# Patient Record
Sex: Female | Born: 1945
Health system: Southern US, Community
[De-identification: ages and names within clinical notes are randomized; demographics above are authoritative.]

## PROBLEM LIST (undated history)

## (undated) DIAGNOSIS — I10 Essential (primary) hypertension: Secondary | ICD-10-CM

## (undated) DIAGNOSIS — Z923 Personal history of irradiation: Secondary | ICD-10-CM

## (undated) DIAGNOSIS — K5790 Diverticulosis of intestine, part unspecified, without perforation or abscess without bleeding: Secondary | ICD-10-CM

## (undated) DIAGNOSIS — C49A2 Gastrointestinal stromal tumor of stomach: Principal | ICD-10-CM

## (undated) DIAGNOSIS — C801 Malignant (primary) neoplasm, unspecified: Secondary | ICD-10-CM

## (undated) DIAGNOSIS — K219 Gastro-esophageal reflux disease without esophagitis: Secondary | ICD-10-CM

## (undated) DIAGNOSIS — K2270 Barrett's esophagus without dysplasia: Secondary | ICD-10-CM

## (undated) DIAGNOSIS — H409 Unspecified glaucoma: Secondary | ICD-10-CM

## (undated) DIAGNOSIS — K449 Diaphragmatic hernia without obstruction or gangrene: Secondary | ICD-10-CM

## (undated) DIAGNOSIS — C50919 Malignant neoplasm of unspecified site of unspecified female breast: Secondary | ICD-10-CM

## (undated) DIAGNOSIS — E7800 Pure hypercholesterolemia, unspecified: Secondary | ICD-10-CM

## (undated) HISTORY — DX: Unspecified glaucoma: H40.9

## (undated) HISTORY — PX: STOMACH SURGERY: SHX791

## (undated) HISTORY — DX: Gastro-esophageal reflux disease without esophagitis: K21.9

## (undated) HISTORY — DX: Diaphragmatic hernia without obstruction or gangrene: K44.9

## (undated) HISTORY — DX: Pure hypercholesterolemia, unspecified: E78.00

## (undated) HISTORY — PX: COLONOSCOPY: SHX174

## (undated) HISTORY — DX: Gastrointestinal stromal tumor of stomach: C49.A2

## (undated) HISTORY — PX: BREAST BIOPSY: SHX20

## (undated) HISTORY — DX: Personal history of irradiation: Z92.3

## (undated) HISTORY — PX: OTHER SURGICAL HISTORY: SHX169

## (undated) HISTORY — DX: Diverticulosis of intestine, part unspecified, without perforation or abscess without bleeding: K57.90

## (undated) HISTORY — DX: Essential (primary) hypertension: I10

## (undated) HISTORY — DX: Barrett's esophagus without dysplasia: K22.70

## (undated) HISTORY — DX: Malignant neoplasm of unspecified site of unspecified female breast: C50.919

## (undated) HISTORY — DX: Malignant (primary) neoplasm, unspecified: C80.1

---

## 1998-01-29 ENCOUNTER — Other Ambulatory Visit: Admission: RE | Admit: 1998-01-29 | Discharge: 1998-01-29 | Payer: Self-pay | Admitting: Obstetrics and Gynecology

## 1998-07-24 ENCOUNTER — Other Ambulatory Visit: Admission: RE | Admit: 1998-07-24 | Discharge: 1998-07-24 | Payer: Self-pay | Admitting: Radiology

## 1998-12-12 ENCOUNTER — Other Ambulatory Visit: Admission: RE | Admit: 1998-12-12 | Discharge: 1998-12-12 | Payer: Self-pay | Admitting: Internal Medicine

## 1998-12-12 ENCOUNTER — Encounter (INDEPENDENT_AMBULATORY_CARE_PROVIDER_SITE_OTHER): Payer: Self-pay | Admitting: Specialist

## 1999-01-20 ENCOUNTER — Ambulatory Visit (HOSPITAL_BASED_OUTPATIENT_CLINIC_OR_DEPARTMENT_OTHER): Admission: RE | Admit: 1999-01-20 | Discharge: 1999-01-20 | Payer: Self-pay | Admitting: Surgery

## 2001-11-08 ENCOUNTER — Other Ambulatory Visit: Admission: RE | Admit: 2001-11-08 | Discharge: 2001-11-08 | Payer: Self-pay | Admitting: Obstetrics and Gynecology

## 2002-12-25 ENCOUNTER — Other Ambulatory Visit: Admission: RE | Admit: 2002-12-25 | Discharge: 2002-12-25 | Payer: Self-pay | Admitting: Obstetrics and Gynecology

## 2003-09-19 ENCOUNTER — Ambulatory Visit (HOSPITAL_COMMUNITY): Admission: RE | Admit: 2003-09-19 | Discharge: 2003-09-19 | Payer: Self-pay | Admitting: Plastic Surgery

## 2003-09-19 ENCOUNTER — Encounter (INDEPENDENT_AMBULATORY_CARE_PROVIDER_SITE_OTHER): Payer: Self-pay | Admitting: Plastic Surgery

## 2003-09-19 ENCOUNTER — Ambulatory Visit (HOSPITAL_BASED_OUTPATIENT_CLINIC_OR_DEPARTMENT_OTHER): Admission: RE | Admit: 2003-09-19 | Discharge: 2003-09-19 | Payer: Self-pay | Admitting: Plastic Surgery

## 2004-03-30 DIAGNOSIS — C49A2 Gastrointestinal stromal tumor of stomach: Secondary | ICD-10-CM

## 2004-03-30 DIAGNOSIS — C801 Malignant (primary) neoplasm, unspecified: Secondary | ICD-10-CM

## 2004-03-30 HISTORY — DX: Gastrointestinal stromal tumor of stomach: C49.A2

## 2004-03-30 HISTORY — DX: Malignant (primary) neoplasm, unspecified: C80.1

## 2004-05-15 ENCOUNTER — Ambulatory Visit: Payer: Self-pay | Admitting: Gastroenterology

## 2004-05-21 ENCOUNTER — Ambulatory Visit: Payer: Self-pay | Admitting: Gastroenterology

## 2004-06-05 ENCOUNTER — Encounter: Admission: RE | Admit: 2004-06-05 | Discharge: 2004-06-05 | Payer: Self-pay | Admitting: General Surgery

## 2004-06-12 ENCOUNTER — Inpatient Hospital Stay (HOSPITAL_COMMUNITY): Admission: RE | Admit: 2004-06-12 | Discharge: 2004-06-16 | Payer: Self-pay | Admitting: General Surgery

## 2004-06-12 ENCOUNTER — Encounter (INDEPENDENT_AMBULATORY_CARE_PROVIDER_SITE_OTHER): Payer: Self-pay | Admitting: *Deleted

## 2004-06-12 HISTORY — PX: EXPLORATORY LAPAROTOMY: SUR591

## 2004-06-24 ENCOUNTER — Ambulatory Visit: Payer: Self-pay | Admitting: General Surgery

## 2004-07-03 ENCOUNTER — Ambulatory Visit: Payer: Self-pay | Admitting: Oncology

## 2004-07-17 ENCOUNTER — Encounter (INDEPENDENT_AMBULATORY_CARE_PROVIDER_SITE_OTHER): Payer: Self-pay | Admitting: *Deleted

## 2004-08-08 ENCOUNTER — Ambulatory Visit (HOSPITAL_COMMUNITY): Admission: RE | Admit: 2004-08-08 | Discharge: 2004-08-08 | Payer: Self-pay | Admitting: Oncology

## 2004-08-18 ENCOUNTER — Ambulatory Visit: Payer: Self-pay | Admitting: Oncology

## 2004-10-03 ENCOUNTER — Ambulatory Visit: Payer: Self-pay | Admitting: Oncology

## 2004-10-16 ENCOUNTER — Ambulatory Visit: Payer: Self-pay | Admitting: Gastroenterology

## 2004-11-26 ENCOUNTER — Ambulatory Visit (HOSPITAL_COMMUNITY): Admission: RE | Admit: 2004-11-26 | Discharge: 2004-11-26 | Payer: Self-pay | Admitting: Oncology

## 2004-11-27 ENCOUNTER — Ambulatory Visit: Payer: Self-pay | Admitting: Oncology

## 2004-12-12 ENCOUNTER — Ambulatory Visit: Payer: Self-pay | Admitting: Gastroenterology

## 2005-01-01 ENCOUNTER — Ambulatory Visit (HOSPITAL_COMMUNITY): Admission: RE | Admit: 2005-01-01 | Discharge: 2005-01-01 | Payer: Self-pay | Admitting: Gastroenterology

## 2005-01-01 ENCOUNTER — Encounter: Payer: Self-pay | Admitting: Gastroenterology

## 2005-01-23 ENCOUNTER — Ambulatory Visit: Payer: Self-pay | Admitting: Gastroenterology

## 2005-01-26 ENCOUNTER — Ambulatory Visit: Payer: Self-pay | Admitting: Oncology

## 2005-02-25 ENCOUNTER — Ambulatory Visit (HOSPITAL_COMMUNITY): Admission: RE | Admit: 2005-02-25 | Discharge: 2005-02-25 | Payer: Self-pay | Admitting: Oncology

## 2005-05-18 ENCOUNTER — Ambulatory Visit: Payer: Self-pay | Admitting: Oncology

## 2005-05-20 ENCOUNTER — Ambulatory Visit (HOSPITAL_COMMUNITY): Admission: RE | Admit: 2005-05-20 | Discharge: 2005-05-20 | Payer: Self-pay | Admitting: Oncology

## 2005-08-14 ENCOUNTER — Ambulatory Visit: Payer: Self-pay | Admitting: Oncology

## 2005-08-18 LAB — CBC WITH DIFFERENTIAL/PLATELET
Basophils Absolute: 0 10*3/uL (ref 0.0–0.1)
Eosinophils Absolute: 0.1 10*3/uL (ref 0.0–0.5)
HCT: 37.9 % (ref 34.8–46.6)
HGB: 13.1 g/dL (ref 11.6–15.9)
LYMPH%: 34.5 % (ref 14.0–48.0)
MCV: 104.6 fL — ABNORMAL HIGH (ref 81.0–101.0)
MONO#: 0.3 10*3/uL (ref 0.1–0.9)
MONO%: 7.9 % (ref 0.0–13.0)
NEUT#: 1.9 10*3/uL (ref 1.5–6.5)
Platelets: 249 10*3/uL (ref 145–400)
WBC: 3.4 10*3/uL — ABNORMAL LOW (ref 3.9–10.0)

## 2005-08-18 LAB — COMPREHENSIVE METABOLIC PANEL
Albumin: 4.1 g/dL (ref 3.5–5.2)
Alkaline Phosphatase: 48 U/L (ref 39–117)
BUN: 19 mg/dL (ref 6–23)
CO2: 27 mEq/L (ref 19–32)
Glucose, Bld: 92 mg/dL (ref 70–99)
Potassium: 3.8 mEq/L (ref 3.5–5.3)
Sodium: 138 mEq/L (ref 135–145)
Total Bilirubin: 0.9 mg/dL (ref 0.3–1.2)
Total Protein: 6.3 g/dL (ref 6.0–8.3)

## 2005-08-19 ENCOUNTER — Ambulatory Visit (HOSPITAL_COMMUNITY): Admission: RE | Admit: 2005-08-19 | Discharge: 2005-08-19 | Payer: Self-pay | Admitting: Oncology

## 2005-11-15 ENCOUNTER — Ambulatory Visit: Payer: Self-pay | Admitting: Oncology

## 2005-11-24 LAB — COMPREHENSIVE METABOLIC PANEL
Alkaline Phosphatase: 39 U/L (ref 39–117)
BUN: 17 mg/dL (ref 6–23)
CO2: 28 mEq/L (ref 19–32)
Creatinine, Ser: 0.75 mg/dL (ref 0.40–1.20)
Glucose, Bld: 117 mg/dL — ABNORMAL HIGH (ref 70–99)
Sodium: 140 mEq/L (ref 135–145)
Total Bilirubin: 1 mg/dL (ref 0.3–1.2)

## 2005-11-24 LAB — CBC WITH DIFFERENTIAL/PLATELET
Basophils Absolute: 0 10*3/uL (ref 0.0–0.1)
Eosinophils Absolute: 0.1 10*3/uL (ref 0.0–0.5)
HCT: 42.8 % (ref 34.8–46.6)
LYMPH%: 28.8 % (ref 14.0–48.0)
MCV: 99.4 fL (ref 81.0–101.0)
MONO%: 6.6 % (ref 0.0–13.0)
NEUT#: 4 10*3/uL (ref 1.5–6.5)
NEUT%: 63.3 % (ref 39.6–76.8)
Platelets: 238 10*3/uL (ref 145–400)
RBC: 4.31 10*6/uL (ref 3.70–5.32)

## 2005-11-27 ENCOUNTER — Ambulatory Visit (HOSPITAL_COMMUNITY): Admission: RE | Admit: 2005-11-27 | Discharge: 2005-11-27 | Payer: Self-pay | Admitting: Oncology

## 2005-12-03 ENCOUNTER — Ambulatory Visit: Payer: Self-pay | Admitting: Gastroenterology

## 2006-02-18 IMAGING — CT CT PELVIS W/ CM
2 of 5 series · 16 of 46 positions shown, 18 images · IV contrast (omnipaque)
Comparison: 11/26/04, 08/08/04, and 06/05/04.

CLINICAL DATA: History of gastrointestinal stromal tumor removed May 2004. 
 CHEST CT WITH CONTRAST:
TECHNIQUE: Multidetector CT imaging of the chest was performed following the standard protocol during bolus administration of intravenous contrast.
 Contrast:  100 cc Omnipaque 300
TECHNIQUE: Multidetector CT imaging of the abdomen was performed following the standard protocol during bolus administration of intravenous contrast.
TECHNIQUE: Multidetector CT imaging of the pelvis was performed following the standard protocol during bolus administration of intravenous contrast.

[Series 2: cap 5.0 b40f st · axial · 0.68mm/px · z∈[-605,-55]mm · 13 of 124 slices shown, 15 images]
[im 7/124  soft-tissue]
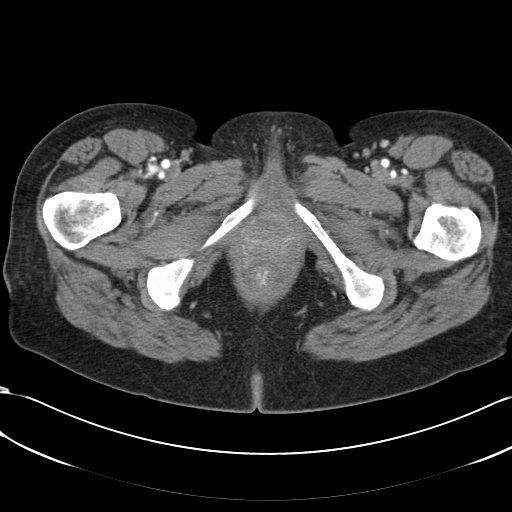
[im 7/124  bone]
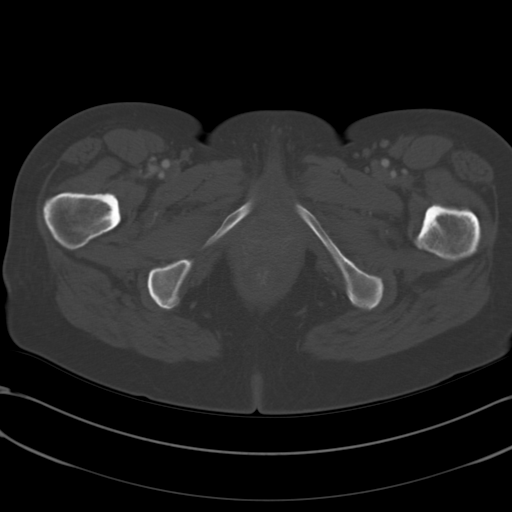
[im 14/124  soft-tissue]
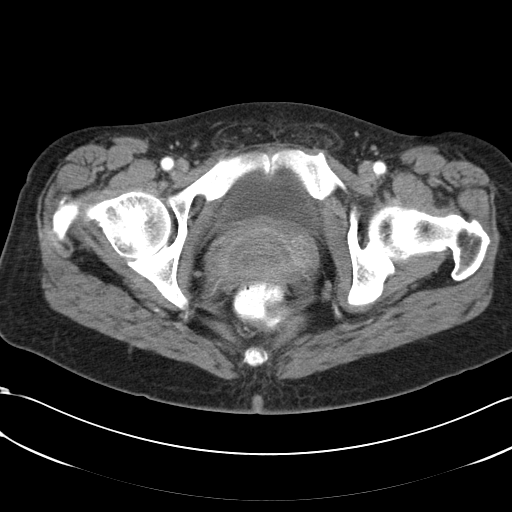
[im 28/124  soft-tissue]
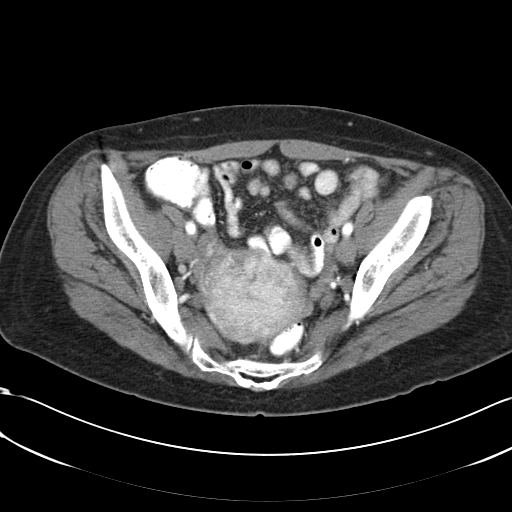
[im 35/124  soft-tissue]
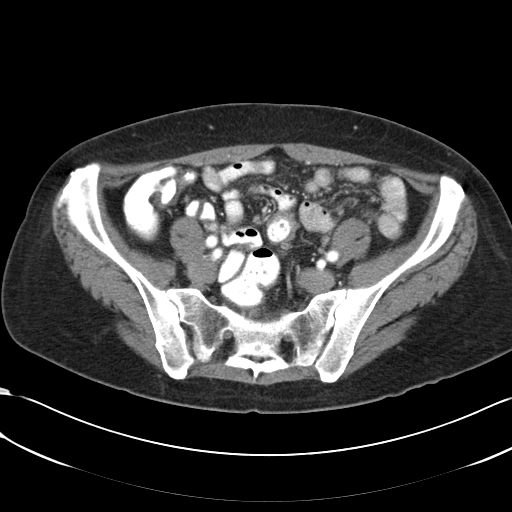
[im 42/124  soft-tissue]
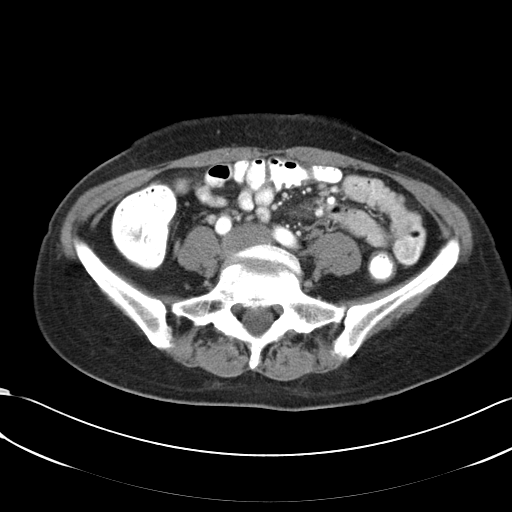
[im 55/124  soft-tissue]
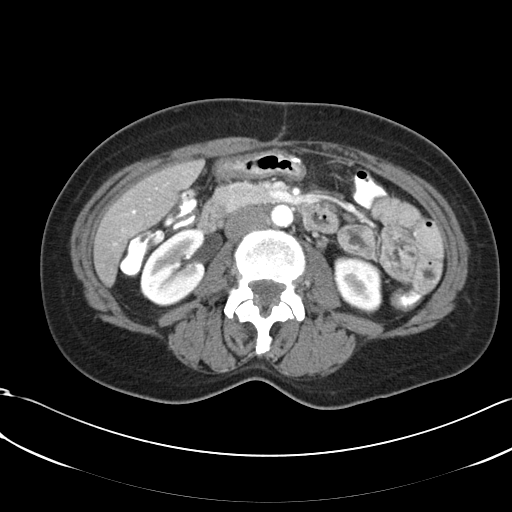
[im 62/124  soft-tissue]
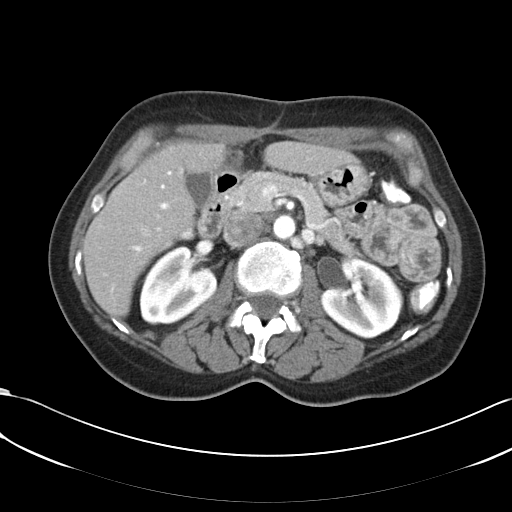
[im 69/124  soft-tissue]
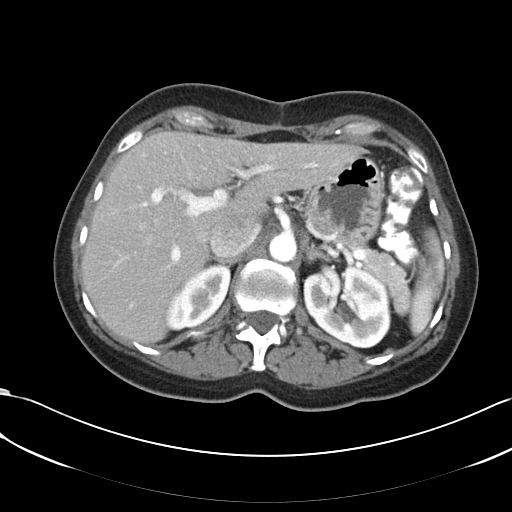
[im 83/124  soft-tissue]
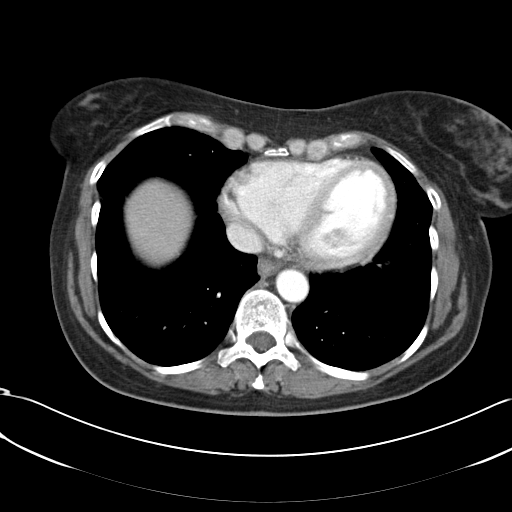
[im 83/124  bone]
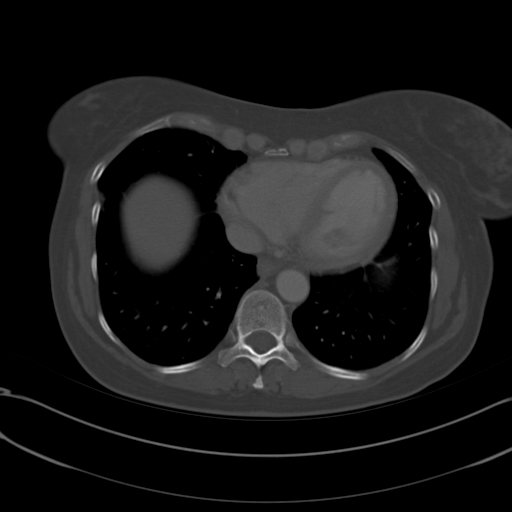
[im 89/124  soft-tissue]
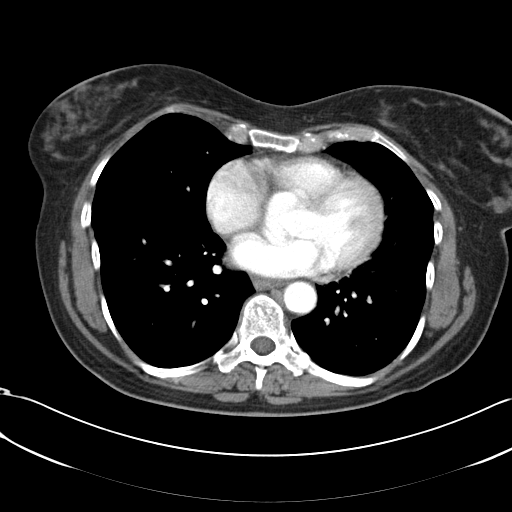
[im 96/124  soft-tissue]
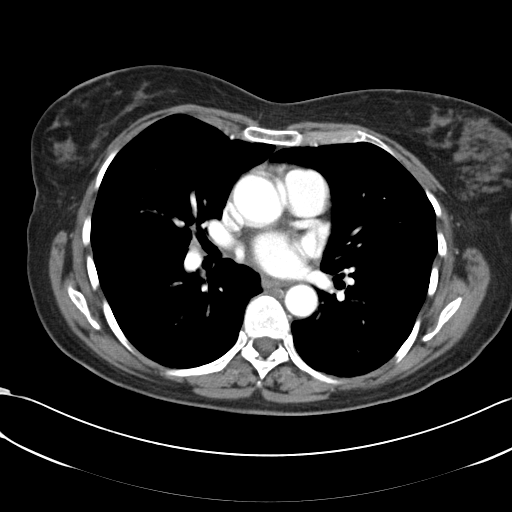
[im 110/124  soft-tissue]
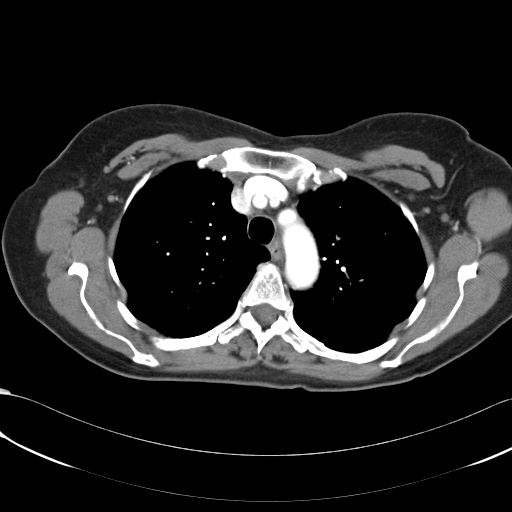
[im 117/124  soft-tissue]
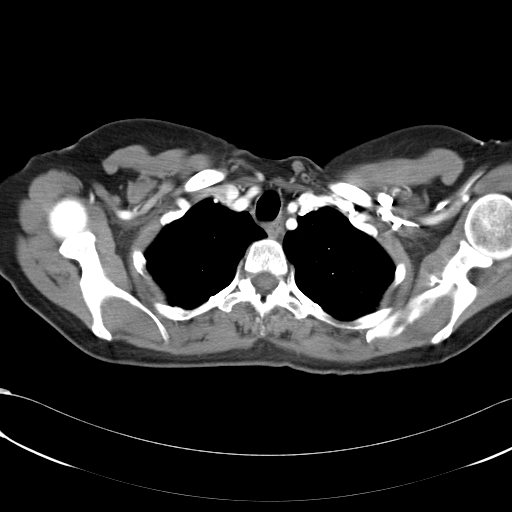

[Series 603: coronal · coronal · 1.24mm/px · 3 of 39 slices shown]
[im 13/39  soft-tissue]
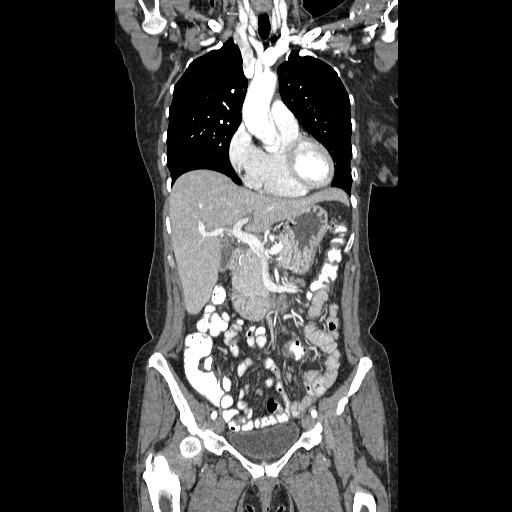
[im 17/39  soft-tissue]
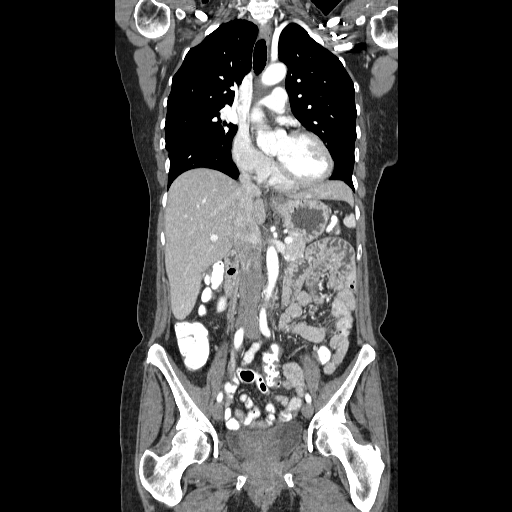
[im 22/39  soft-tissue]
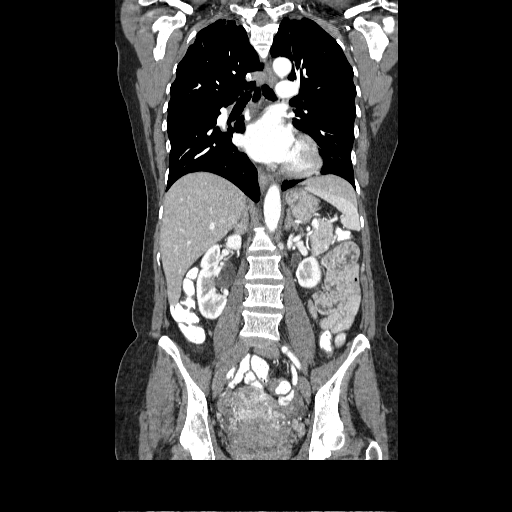

[16 of 46 positions shown; findings below may reference images not displayed]

FINDINGS: Chest wall, soft tissues, and bony structures are unremarkable.  No breast masses or axillary adenopathy.  There are a few small stable axillary lymph nodes bilaterally. 
 Heart size is normal.   Patient has a pectus deformity with some impression on the right ventricle.  No mediastinal or hilar adenopathy.  The esophagus is grossly normal.  The aorta is normal in caliber and no dissection is seen.  No worrisome lung masses or nodules.   No acute pulmonary findings.  There are a few patchy areas of subpleural atelectasis.  No effusions or pleural thickening.  No significant bony findings.
IMPRESSION: Unremarkable and stable CT appearance of the chest.  No evidence for metastatic disease. 
 ABDOMEN CT WITH CONTRAST:
FINDINGS: The liver, spleen, pancreas, adrenal glands, and kidneys are unremarkable and stable.  Surgical changes involving the stomach and some mild persistent antral wall thickening but no evidence for residual or recurrent tumor.  The duodenum, small bowel, and colon are unremarkable.  The aorta is normal in caliber.  No mesenteric or retroperitoneal masses or adenopathy.
IMPRESSION: 1.  Stable postsurgical changes involving the stomach with mild uniform antral wall thickening.  
 2.  No other significant findings in the abdomen. 
 PELVIS CT WITH CONTRAST:
FINDINGS: The appendix is visualized and is normal.  There are scattered areas of diverticulosis involving the sigmoid colon.  The uterus is slightly prominent but is stable.  Ovaries appear normal.  No pelvic masses, adenopathy, or free pelvic fluid collections.  No significant bony findings.  The bladder appears normal.
IMPRESSION: Unremarkable and stable CT appearance of the pelvis.  No evidence for metastatic disease.

## 2006-02-25 ENCOUNTER — Ambulatory Visit: Payer: Self-pay | Admitting: Oncology

## 2006-03-02 LAB — CBC WITH DIFFERENTIAL/PLATELET
BASO%: 0.4 % (ref 0.0–2.0)
Eosinophils Absolute: 0.1 10*3/uL (ref 0.0–0.5)
LYMPH%: 28.3 % (ref 14.0–48.0)
MCHC: 33.8 g/dL (ref 32.0–36.0)
MCV: 97.7 fL (ref 81.0–101.0)
MONO%: 6 % (ref 0.0–13.0)
Platelets: 275 10*3/uL (ref 145–400)
RBC: 4.47 10*6/uL (ref 3.70–5.32)

## 2006-03-02 LAB — COMPREHENSIVE METABOLIC PANEL
Alkaline Phosphatase: 45 U/L (ref 39–117)
Creatinine, Ser: 0.74 mg/dL (ref 0.40–1.20)
Glucose, Bld: 110 mg/dL — ABNORMAL HIGH (ref 70–99)
Sodium: 136 mEq/L (ref 135–145)
Total Bilirubin: 0.9 mg/dL (ref 0.3–1.2)
Total Protein: 7 g/dL (ref 6.0–8.3)

## 2006-03-03 ENCOUNTER — Encounter (INDEPENDENT_AMBULATORY_CARE_PROVIDER_SITE_OTHER): Payer: Self-pay | Admitting: *Deleted

## 2006-03-03 ENCOUNTER — Ambulatory Visit (HOSPITAL_COMMUNITY): Admission: RE | Admit: 2006-03-03 | Discharge: 2006-03-03 | Payer: Self-pay | Admitting: Oncology

## 2006-03-31 ENCOUNTER — Ambulatory Visit: Payer: Self-pay | Admitting: Internal Medicine

## 2006-06-03 ENCOUNTER — Encounter (INDEPENDENT_AMBULATORY_CARE_PROVIDER_SITE_OTHER): Payer: Self-pay | Admitting: Specialist

## 2006-06-03 ENCOUNTER — Ambulatory Visit: Payer: Self-pay | Admitting: Internal Medicine

## 2006-06-07 ENCOUNTER — Ambulatory Visit: Payer: Self-pay | Admitting: Oncology

## 2006-07-06 LAB — CBC WITH DIFFERENTIAL/PLATELET
Basophils Absolute: 0 10*3/uL (ref 0.0–0.1)
Eosinophils Absolute: 0.2 10*3/uL (ref 0.0–0.5)
HGB: 15.4 g/dL (ref 11.6–15.9)
MCV: 96.4 fL (ref 81.0–101.0)
MONO#: 0.3 10*3/uL (ref 0.1–0.9)
MONO%: 5.4 % (ref 0.0–13.0)
NEUT#: 3.6 10*3/uL (ref 1.5–6.5)
RBC: 4.55 10*6/uL (ref 3.70–5.32)
RDW: 13.1 % (ref 11.3–14.5)
WBC: 5.8 10*3/uL (ref 3.9–10.0)

## 2006-07-06 LAB — COMPREHENSIVE METABOLIC PANEL
Albumin: 4.3 g/dL (ref 3.5–5.2)
BUN: 20 mg/dL (ref 6–23)
CO2: 28 mEq/L (ref 19–32)
Calcium: 9.6 mg/dL (ref 8.4–10.5)
Chloride: 103 mEq/L (ref 96–112)
Glucose, Bld: 129 mg/dL — ABNORMAL HIGH (ref 70–99)
Potassium: 4 mEq/L (ref 3.5–5.3)
Sodium: 141 mEq/L (ref 135–145)
Total Protein: 6.9 g/dL (ref 6.0–8.3)

## 2006-09-30 ENCOUNTER — Ambulatory Visit: Payer: Self-pay | Admitting: Oncology

## 2006-10-05 LAB — CBC WITH DIFFERENTIAL/PLATELET
Basophils Absolute: 0 10*3/uL (ref 0.0–0.1)
Eosinophils Absolute: 0.2 10*3/uL (ref 0.0–0.5)
HGB: 14.7 g/dL (ref 11.6–15.9)
MCV: 96 fL (ref 81.0–101.0)
MONO#: 0.4 10*3/uL (ref 0.1–0.9)
MONO%: 7.7 % (ref 0.0–13.0)
NEUT#: 3 10*3/uL (ref 1.5–6.5)
RBC: 4.46 10*6/uL (ref 3.70–5.32)
RDW: 13 % (ref 11.3–14.5)
WBC: 5.2 10*3/uL (ref 3.9–10.0)
lymph#: 1.6 10*3/uL (ref 0.9–3.3)

## 2006-10-05 LAB — COMPREHENSIVE METABOLIC PANEL
AST: 11 U/L (ref 0–37)
Albumin: 4.2 g/dL (ref 3.5–5.2)
Alkaline Phosphatase: 49 U/L (ref 39–117)
Calcium: 9.2 mg/dL (ref 8.4–10.5)
Chloride: 99 mEq/L (ref 96–112)
Glucose, Bld: 103 mg/dL — ABNORMAL HIGH (ref 70–99)
Potassium: 3.6 mEq/L (ref 3.5–5.3)
Sodium: 137 mEq/L (ref 135–145)
Total Protein: 6.8 g/dL (ref 6.0–8.3)

## 2007-01-31 ENCOUNTER — Ambulatory Visit: Payer: Self-pay | Admitting: Internal Medicine

## 2007-02-15 ENCOUNTER — Encounter (INDEPENDENT_AMBULATORY_CARE_PROVIDER_SITE_OTHER): Payer: Self-pay | Admitting: *Deleted

## 2007-02-15 ENCOUNTER — Ambulatory Visit: Payer: Self-pay | Admitting: Internal Medicine

## 2007-03-03 ENCOUNTER — Ambulatory Visit: Payer: Self-pay | Admitting: Oncology

## 2007-03-08 ENCOUNTER — Ambulatory Visit (HOSPITAL_COMMUNITY): Admission: RE | Admit: 2007-03-08 | Discharge: 2007-03-08 | Payer: Self-pay | Admitting: Oncology

## 2007-03-08 ENCOUNTER — Encounter (INDEPENDENT_AMBULATORY_CARE_PROVIDER_SITE_OTHER): Payer: Self-pay | Admitting: *Deleted

## 2007-03-08 LAB — COMPREHENSIVE METABOLIC PANEL
ALT: 13 U/L (ref 0–35)
AST: 11 U/L (ref 0–37)
Albumin: 3.6 g/dL (ref 3.5–5.2)
Alkaline Phosphatase: 70 U/L (ref 39–117)
Potassium: 3.5 mEq/L (ref 3.5–5.3)
Sodium: 141 mEq/L (ref 135–145)
Total Protein: 6.7 g/dL (ref 6.0–8.3)

## 2007-03-08 LAB — CBC WITH DIFFERENTIAL/PLATELET
EOS%: 2 % (ref 0.0–7.0)
MCH: 33 pg (ref 26.0–34.0)
MCV: 94.6 fL (ref 81.0–101.0)
MONO%: 3.3 % (ref 0.0–13.0)
NEUT#: 6 10*3/uL (ref 1.5–6.5)
RBC: 4.26 10*6/uL (ref 3.70–5.32)
RDW: 12.5 % (ref 11.3–14.5)
lymph#: 1.8 10*3/uL (ref 0.9–3.3)

## 2007-09-07 ENCOUNTER — Ambulatory Visit: Payer: Self-pay | Admitting: Oncology

## 2007-09-12 ENCOUNTER — Encounter: Payer: Self-pay | Admitting: Internal Medicine

## 2008-03-09 ENCOUNTER — Ambulatory Visit: Payer: Self-pay | Admitting: Oncology

## 2008-03-13 ENCOUNTER — Encounter: Payer: Self-pay | Admitting: Internal Medicine

## 2008-07-13 DIAGNOSIS — Z8719 Personal history of other diseases of the digestive system: Secondary | ICD-10-CM | POA: Insufficient documentation

## 2008-07-13 DIAGNOSIS — K5730 Diverticulosis of large intestine without perforation or abscess without bleeding: Secondary | ICD-10-CM | POA: Insufficient documentation

## 2008-07-13 DIAGNOSIS — D214 Benign neoplasm of connective and other soft tissue of abdomen: Secondary | ICD-10-CM | POA: Insufficient documentation

## 2008-07-13 DIAGNOSIS — K2270 Barrett's esophagus without dysplasia: Secondary | ICD-10-CM | POA: Insufficient documentation

## 2008-07-13 DIAGNOSIS — K449 Diaphragmatic hernia without obstruction or gangrene: Secondary | ICD-10-CM | POA: Insufficient documentation

## 2008-07-13 DIAGNOSIS — I10 Essential (primary) hypertension: Secondary | ICD-10-CM | POA: Insufficient documentation

## 2008-07-18 ENCOUNTER — Ambulatory Visit: Payer: Self-pay | Admitting: Internal Medicine

## 2008-07-19 LAB — CONVERTED CEMR LAB
Basophils Absolute: 0.1 10*3/uL (ref 0.0–0.1)
Basophils Relative: 1.7 % (ref 0.0–3.0)
Eosinophils Absolute: 0.2 10*3/uL (ref 0.0–0.7)
Eosinophils Relative: 3.3 % (ref 0.0–5.0)
HCT: 43.9 % (ref 36.0–46.0)
Hemoglobin: 15.5 g/dL — ABNORMAL HIGH (ref 12.0–15.0)
Iron: 148 ug/dL — ABNORMAL HIGH (ref 42–145)
Lymphocytes Relative: 32.1 % (ref 12.0–46.0)
Lymphs Abs: 2.1 10*3/uL (ref 0.7–4.0)
MCHC: 35.2 g/dL (ref 30.0–36.0)
MCV: 97.4 fL (ref 78.0–100.0)
Monocytes Absolute: 0.4 10*3/uL (ref 0.1–1.0)
Monocytes Relative: 5.8 % (ref 3.0–12.0)
Neutro Abs: 3.7 10*3/uL (ref 1.4–7.7)
Neutrophils Relative %: 57.1 % (ref 43.0–77.0)
Platelets: 231 10*3/uL (ref 150.0–400.0)
RBC: 4.51 M/uL (ref 3.87–5.11)
RDW: 11.9 % (ref 11.5–14.6)
Saturation Ratios: 43.9 % (ref 20.0–50.0)
Transferrin: 240.6 mg/dL (ref 212.0–360.0)
Vitamin B-12: 395 pg/mL (ref 211–911)
WBC: 6.5 10*3/uL (ref 4.5–10.5)

## 2008-09-07 ENCOUNTER — Ambulatory Visit: Payer: Self-pay | Admitting: Oncology

## 2008-09-11 ENCOUNTER — Encounter: Payer: Self-pay | Admitting: Internal Medicine

## 2008-10-10 ENCOUNTER — Ambulatory Visit (HOSPITAL_COMMUNITY): Admission: RE | Admit: 2008-10-10 | Discharge: 2008-10-10 | Payer: Self-pay | Admitting: Obstetrics and Gynecology

## 2008-10-10 ENCOUNTER — Encounter (INDEPENDENT_AMBULATORY_CARE_PROVIDER_SITE_OTHER): Payer: Self-pay | Admitting: Obstetrics and Gynecology

## 2008-10-23 ENCOUNTER — Ambulatory Visit: Payer: Self-pay | Admitting: Internal Medicine

## 2008-10-23 ENCOUNTER — Encounter: Payer: Self-pay | Admitting: Internal Medicine

## 2008-10-28 ENCOUNTER — Encounter: Payer: Self-pay | Admitting: Internal Medicine

## 2009-04-10 ENCOUNTER — Ambulatory Visit: Payer: Self-pay | Admitting: Oncology

## 2009-04-12 ENCOUNTER — Encounter: Payer: Self-pay | Admitting: Internal Medicine

## 2009-05-02 ENCOUNTER — Telehealth (INDEPENDENT_AMBULATORY_CARE_PROVIDER_SITE_OTHER): Payer: Self-pay | Admitting: *Deleted

## 2009-10-08 ENCOUNTER — Ambulatory Visit: Payer: Self-pay | Admitting: Oncology

## 2009-10-10 ENCOUNTER — Encounter: Payer: Self-pay | Admitting: Internal Medicine

## 2010-03-07 ENCOUNTER — Telehealth: Payer: Self-pay | Admitting: Internal Medicine

## 2010-04-29 NOTE — Progress Notes (Signed)
Summary: Records request from [facility]  Request for records received from [facility]. Request forwarded to [employer]. Dena Chavis  May 02, 2009 12:21 PM

## 2010-04-29 NOTE — Letter (Signed)
[facility]   Imported By: Sherian Rein 11/07/2009 09:58:27  _____________________________________________________________________  External Attachment:    Type:   Image     Comment:   External Document

## 2010-04-29 NOTE — Progress Notes (Signed)
Summary: Refill  Medications Added PANTOPRAZOLE SODIUM 40 MG  TBEC (PANTOPRAZOLE SODIUM) Take 1 tablet by mouth once a day. MUST KEEP OFFICE VISIT FOR FURTHER REFILLS!       Phone Note Call from Patient Call back at Home Phone [phone]   Caller: Patient Call For: Dr. Juanda Chance Reason for Call: Refill Medication Details for Reason: Refill Summary of Call: Patient needs refill of pantoprazole sodium as she is completely out.  She was also informed that she needed an OV with Dr. Juanda Chance.  An OV has been scheduled for 04/17/10 @ 8:30 a.m. Initial call taken by: Schuyler Amor,  March 07, 2010 9:27 AM  Follow-up for Phone Call        prescription sent to patient's pharmacy until her upcoming appointment in January. Follow-up by: Lamona Curl CMA Duncan Dull),  March 07, 2010 9:28 AM    New/Updated Medications: PANTOPRAZOLE SODIUM 40 MG  TBEC (PANTOPRAZOLE SODIUM) Take 1 tablet by mouth once a day. MUST KEEP OFFICE VISIT FOR FURTHER REFILLS! Prescriptions: PANTOPRAZOLE SODIUM 40 MG  TBEC (PANTOPRAZOLE SODIUM) Take 1 tablet by mouth once a day. MUST KEEP OFFICE VISIT FOR FURTHER REFILLS!  #30 x 1   Entered by:   Lamona Curl CMA (AAMA)   Authorized by:   Hart Carwin MD   Signed by:   Lamona Curl CMA (AAMA) on 03/07/2010   Method used:   Electronically to        [facility] (retail)       [address]       [place], [place]  [id number]       Ph: [phone] or [phone]       Fax: [phone]   RxID:   [id number]

## 2010-04-29 NOTE — Letter (Signed)
[facility]   Imported By: Sherian Rein 05/13/2009 08:26:20  _____________________________________________________________________  External Attachment:    Type:   Image     Comment:   External Document

## 2010-05-12 ENCOUNTER — Ambulatory Visit (INDEPENDENT_AMBULATORY_CARE_PROVIDER_SITE_OTHER): Payer: BC Managed Care – PPO | Admitting: Internal Medicine

## 2010-05-12 ENCOUNTER — Encounter (INDEPENDENT_AMBULATORY_CARE_PROVIDER_SITE_OTHER): Payer: Self-pay | Admitting: *Deleted

## 2010-05-12 ENCOUNTER — Encounter: Payer: Self-pay | Admitting: Internal Medicine

## 2010-05-12 ENCOUNTER — Other Ambulatory Visit: Payer: BC Managed Care – PPO

## 2010-05-12 ENCOUNTER — Other Ambulatory Visit: Payer: Self-pay | Admitting: Internal Medicine

## 2010-05-12 DIAGNOSIS — K2270 Barrett's esophagus without dysplasia: Secondary | ICD-10-CM

## 2010-05-12 DIAGNOSIS — Z8719 Personal history of other diseases of the digestive system: Secondary | ICD-10-CM

## 2010-05-12 LAB — CBC WITH DIFFERENTIAL/PLATELET
Basophils Absolute: 0 10*3/uL (ref 0.0–0.1)
Eosinophils Absolute: 0.2 10*3/uL (ref 0.0–0.7)
HCT: 44.9 % (ref 36.0–46.0)
Lymphs Abs: 1.9 10*3/uL (ref 0.7–4.0)
MCHC: 34.7 g/dL (ref 30.0–36.0)
MCV: 99.2 fl (ref 78.0–100.0)
Monocytes Absolute: 0.4 10*3/uL (ref 0.1–1.0)
Neutrophils Relative %: 61.6 % (ref 43.0–77.0)
Platelets: 194 10*3/uL (ref 150.0–400.0)
RDW: 12.8 % (ref 11.5–14.6)
WBC: 6.4 10*3/uL (ref 4.5–10.5)

## 2010-05-12 LAB — IBC PANEL
Iron: 83 ug/dL (ref 42–145)
Transferrin: 246.9 mg/dL (ref 212.0–360.0)

## 2010-05-21 NOTE — Assessment & Plan Note (Signed)
Summary: Medication Refill     History of Present Illness Visit Type: Follow-up Visit Primary GI MD: Lina Sar MD Primary Provider: Joselyn Arrow, MD Requesting Provider: na Chief Complaint: F/u for barrett's esophagus and patient needs refill on Pantoprazole. 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 Followed by Dr. Truett Perna,. Diagnosed with Barrett's esophagus on prior upper endoscopy but not confirmed  onlast endoscopy in July 2010 which showed no evidence of Goblet cells or metaplasia. She has no specific GI complaints ,pantoprazole 40 mg a day but would like to reduce it to minimal dose. Last colonoscopy in 2000 and a recall colonoscopy plan followup in 2018.   GI Review of Systems      Denies abdominal pain, acid reflux, belching, bloating, chest pain, dysphagia with liquids, dysphagia with solids, heartburn, loss of appetite, nausea, vomiting, vomiting blood, weight loss, and  weight gain.        Denies anal fissure, black tarry stools, change in bowel habit, constipation, diarrhea, diverticulosis, fecal incontinence, heme positive stool, hemorrhoids, irritable bowel syndrome, jaundice, light color stool, liver problems, rectal bleeding, and  rectal pain.    Current Medications (verified): 1)  Pantoprazole Sodium 40 Mg  Tbec (Pantoprazole Sodium) .... Take 1 Tablet By Mouth Once A Day. Must Keep Office Visit For Further Refills! 2)  Estradiol 0.06 Mg (Estradiol) .... One Tablet By Mouth Once Daily 3)  Prometrium 100 Mg Caps (Progesterone Micronized) .Marland Kitchen.. 1 Cap By Mouth Once Daily 4)  Potassium Chloride Cr 10 Meq Cr-Caps (Potassium Chloride) .Marland Kitchen.. 1 Capsule By Mouth Once Daily 5)  Hydrochlorothiazide 25 Mg Tabs (Hydrochlorothiazide) .Marland Kitchen.. 1 Tablet By Mouth Once Daily 6)  Pravastatin Sodium 40 Mg Tabs (Pravastatin  Sodium) .Marland Kitchen.. 1 Tablet By Mouth Once Daily 7)  Atenolol 25 Mg Tabs (Atenolol) .Marland Kitchen.. 1 Tablet By Mouth Once Daily 8)  Aspirin 81 Mg  Tabs (Aspirin) .... One Tablet By Mouth Once Daily  Allergies (verified): No Known Drug Allergies  Past History:  Past Medical History: HIATAL HERNIA (ICD-553.3) GASTRITIS, HX OF (ICD-V12.79) DIVERTICULOSIS, COLON (ICD-562.10) Hx of UNSPECIFIED ESSENTIAL HYPERTENSION (ICD-401.9) OTH BEN NEOPLASM CONNECTIVE&OTH SOFT TISSUE ABD (ICD-215.5) BARRETTS ESOPHAGUS (ICD-530.85)  Past Surgical History: Reviewed history from 07/13/2008 and no changes required. Breast Lumpectomy Resection of gastrointestinal stromal tumor  Family History: Reviewed history from 07/18/2008 and no changes required. No FH of Colon Cancer:  Social History: Reviewed history from 07/18/2008 and no changes required. Alcohol Use - yes Illicit Drug Use - no Occupation: retired Associate Professor Patient has never smoked.  Daily Caffeine Use 2 cups per day  Review of Systems  The patient denies allergy/sinus, anemia, anxiety-new, arthritis/joint pain, back pain, blood in urine, breast changes/lumps, change in vision, confusion, cough, coughing up blood, depression-new, fainting, fatigue, fever, headaches-new, hearing problems, heart murmur, heart rhythm changes, itching, menstrual pain, muscle pains/cramps, night sweats, nosebleeds, pregnancy symptoms, shortness of breath, skin rash, sleeping problems, sore throat, swelling of feet/legs, swollen lymph glands, thirst - excessive , urination - excessive , urination changes/pain, urine leakage, vision changes, and voice change.    Vital Signs:  Patient profile:   65 year old female Height:      69 inches Weight:      149 pounds BMI:     22.08 BSA:     1.82 Pulse rate:   64 /  minute Pulse rhythm:   regular BP sitting:   142 / 82  (left arm) Cuff size:   regular  Vitals Entered By: Ok Anis CMA (May 12, 2010 11:33 AM)  Physical  Exam  General:  Well developed, well nourished, no acute distress. Eyes:  PERRLA, no icterus. Mouth:  No deformity or lesions, dentition normal. Neck:  Supple; no masses or thyromegaly. Lungs:  Clear throughout to auscultation. Heart:  Regular rate and rhythm; no murmurs, rubs,  or bruits. Abdomen:  well-healed surgical scar. Normoactive bowel sounds. Rectal:  small amount of Hemoccult negative stool Extremities:  No clubbing, cyanosis, edema or deformities noted. Skin:  Intact without significant lesions or rashes. Psych:  Alert and cooperative. Normal mood and affect.   Impression & Recommendations:  Problem # 1:  BARRETTS ESOPHAGUS (ICD-530.85) not confirmed on last endoscopy in July 2010. Next endoscopy July 2015 Orders: TLB-IBC Pnl (Iron/FE;Transferrin) (83550-IBC)  Problem # 2:  GASTRITIS, HX OF (ICD-V12.79) Will switch from pantoprazole to Pepcid 40 mg daily also check her B12 level today as well as iron stores. She had a complete physical exam by Dr.Tisovec in January 2012. Orders: TLB-IBC Pnl (Iron/FE;Transferrin) (83550-IBC)  Other Orders: TLB-CBC Platelet - w/Differential (85025-CBCD)  Patient Instructions: 1)  Please pick up your prescriptions at the pharmacy. Electronic prescription(s) has already been sent for Pepcid 40 mg, take 1 tablet once daily in place of Pantoprazole. 2)  recall upper endoscopy 09/2013 3)  Recall colonoscopy November 2018 4)  Your physician requests that you go to the basement floor of our office to have the following labwork completed before leaving today: CBC, IBC B12 panel 5)  The medication list was reviewed and reconciled.  All changed / newly prescribed medications were explained.  A complete medication list was provided to the patient / caregiver.  Appended Document: Orders Update    Clinical Lists Changes  Medications: Changed medication from PANTOPRAZOLE SODIUM 40 MG  TBEC (PANTOPRAZOLE SODIUM) Take 1 tablet by mouth once a day.  MUST KEEP OFFICE VISIT FOR FURTHER REFILLS! to PEPCID 40 MG TABS (FAMOTIDINE) Take 1 tablet by mouth once a day (pharmacy-please d/c any prescription for pantoprazole) - Signed Rx of PEPCID 40 MG TABS (FAMOTIDINE) Take 1 tablet by mouth once a day (pharmacy-please d/c any prescription for pantoprazole);  #90 x 3;  Signed;  Entered by: Lamona Curl CMA (AAMA);  Authorized by: Hart Carwin MD;  Method used: Electronically to [facility] [address], [place], [place]  [id number], Ph: [phone] or [phone], Fax: [phone]    Prescriptions: PEPCID 40 MG TABS (FAMOTIDINE) Take 1 tablet by mouth once a day (pharmacy-please d/c any prescription for pantoprazole)  #90 x 3   Entered by:   Lamona Curl CMA (AAMA)   Authorized by:   Hart Carwin MD   Signed by:   Lamona Curl CMA (AAMA) on 05/12/2010   Method used:   Electronically to        [facility] (retail)       [address]       [place], [place]  [id number]       Ph: [phone] or [phone]       Fax: [phone]   RxID:   [id number]

## 2010-07-06 LAB — CBC
MCHC: 34.9 g/dL (ref 30.0–36.0)
MCV: 98.9 fL (ref 78.0–100.0)
Platelets: 232 10*3/uL (ref 150–400)
WBC: 4.9 10*3/uL (ref 4.0–10.5)

## 2010-07-06 LAB — DIFFERENTIAL
Basophils Relative: 1 % (ref 0–1)
Eosinophils Absolute: 0.1 10*3/uL (ref 0.0–0.7)
Neutrophils Relative %: 53 % (ref 43–77)

## 2010-07-06 LAB — ELECTROLYTE PANEL
Potassium: 3.5 mEq/L (ref 3.5–5.1)
Sodium: 138 mEq/L (ref 135–145)

## 2010-08-12 NOTE — Op Note (Signed)
Hailey Daniels, IGLEHART             ACCOUNT NO.:  [id number]   MEDICAL RECORD NO.:  [id number]          PATIENT TYPE:  AMB   LOCATION:  SDC                           FACILITY:  [facility]   PHYSICIAN:  Sherry A. Dickstein, M.D.DATE OF BIRTH:  [birth year]   DATE OF PROCEDURE:  10/10/2008  DATE OF DISCHARGE:  10/10/2008                               OPERATIVE REPORT   PREOPERATIVE DIAGNOSES:  1. Postmenopausal bleeding.  2. Endometrial polyps.   POSTOPERATIVE DIAGNOSES:  1. Postmenopausal bleeding.  2. Endometrial polyps.   PROCEDURE:  D and C hysteroscopy with resectoscope.   SURGEON:  Sherry A. Rosalio Macadamia, MD   ANESTHESIA:  MAC.   INDICATIONS:  This is a 65 year old, G3, P3-0-0-3, woman who has had  about 3 episodes of vaginal bleeding over the past year.  She has been  on hormone therapy and was evaluated first with an ultrasound followed  by sonohysterogram.  Sonohysterogram showed several endometrial polyps  present, and therefore the patient is brought to the operating room for  a D and C hysteroscopy with resectoscope.   FINDINGS:  Normal-sized retroverted uterus with no adnexal mass and  endometrial polyps present.   PROCEDURE:  The patient was brought into the operating room and given  adequate IV sedation.  She was placed in a dorsal lithotomy position.  Her perineum was washed with Betadine.  The patient was draped in  sterile fashion.  Speculum was placed within the vagina.  Vagina was  washed with Betadine.  Paracervical block was administered with 1%  Nesacaine.  Anterior lip of the cervix was grasped with a single-toothed  tenaculum.  Cervix was sounded.  Cervix was dilated with Pratt dilators  to a #31.  Hysteroscope was introduced in the endometrial cavity, and  pictures were obtained.  Using a single loop right angle resector, the  polyps were resected and then superficial sheets of endometrial tissue  were resected.  Small bleeders were cauterized.  Pictures were  obtained.  All instruments were removed from the vagina.  The patient was taken out  of the dorsal lithotomy position.  She was awakened.  She was moved from  the operating table to a stretcher in stable condition.   COMPLICATIONS:  None.   ESTIMATED BLOOD LOSS:  Less then 5 mL.   GLYCINE DIFFERENTIAL:  Minus 30 mL.      Sherry A. Rosalio Macadamia, M.D.  Electronically Signed     SAD/MEDQ  D:  10/10/2008  T:  10/11/2008  Job:  [id number]

## 2010-08-15 NOTE — Discharge Summary (Signed)
NAMEJAZMYN, OFFNER             ACCOUNT NO.:  [id number]   MEDICAL RECORD NO.:  [id number]          PATIENT TYPE:  INP   LOCATION:  0468                         FACILITY:  [facility]   PHYSICIAN:  Angelia Mould. Derrell Lolling, M.D.DATE OF BIRTH:  26-May-1945   DATE OF ADMISSION:  06/12/2004  DATE OF DISCHARGE:  06/16/2004                                 DISCHARGE SUMMARY   FINAL DIAGNOSES:  1.  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  2.  Hypertension.  3.  Hyperlipidemia.  4.  Multiparity.   OPERATION PERFORMED:  Exploratory laparotomy, excisional biopsy of  hepatoduodenal ligament lymph node, wedge resection of gastrointestinal  stromal tumor of the gastric antrum. Date of surgery was June 12, 2004.   HISTORY:  This is a 65 year old white female in good health. She presented  with painless melena 1 month prior. She was noted to have guaiac positive  stool but her hemoglobin was 14.7. She was placed on Nexium. An upper  endoscopy was performed on May 21, 2004 which showed a small hiatal  hernia and Barrett's esophagus. She had what appeared to be a submucosal  tumor in the antrum of the stomach with central ulceration thought to be a  leiomyoma and the source of bleeding. A biopsy of this showed benign gastric  mucosa, no evidence of malignancy. I was asked to see her as an outpatient.  Evaluation was otherwise unremarkable. We obtained a CT scan of the chest,  abdomen, and pelvis on June 05, 2004 and this shows a 3.1 cm well-  circumscribed mass in the prepyloric region of the stomach. The possibility  of a stalk was mentioned. There was no metastatic disease or adenopathy and  no free fluid. She was advised to undergo elective resection of this and was  admitted to the hospital electively.   PHYSICAL EXAMINATION:  GENERAL:  A thin, pleasant, middle-aged woman in no  distress.  VITAL SIGNS:  Weight 152, blood pressure 148/89, pulse 59.  NECK:  No adenopathy.  LUNGS:   Clear to auscultation.  HEART:  Regular rate and rhythm, no murmur.  ABDOMEN:  Soft, nontender. Liver and spleen not enlarged. No palpable mass,  no hernia.  LYMPHATICS:  No enlarged lymph nodes in the neck or groin.   HOSPITAL COURSE:  On the date of admission, the patient was taken to the  operating room. I found some soft lymph nodes in the hepatoduodenal ligament  and excised one of these. I could palpate what appeared to be a firm, smooth  tumor in the gastric antrum. This appeared to be pedunculated and attached  to a very focal area of the gastric wall. I was able to simply resect this  by wedge resection using the stapling devices without compromising the lumen  of the stomach. We examined this in the operating room and had our  pathologist look at it. It appeared that we had resected it quite nicely and  it appeared to be a benign leiomyoma. We chose to do no further resection at  this time.   Postoperatively, the patient did very well. Nasogastric tube was  removed in  24-48 hours and she assumed a diet and had no problem with that.   Final pathology report showed that the lymph node from the hepatoduodenal  ligament was just benign reactive tissue. The tumor in the stomach was a  gastrointestinal stromal tumor of low grade. Margins were negative. No  atypia was noted. Mytotic activity was very low and this suggested a very  low risk for aggressive behavior. The patient was informed of this.   She was discharged on June 16, 2004. At that time her abdominal incision  was healing well, she was tolerating her diet, was passing flatus, looked  good, and wanted to go home. She was asked to return to see me in the office  in 3-4 days. She was given a prescription for Vicodin for pain. She was told  to continue her usual medications which include Zocor, AcipHex,  atenolol/chlorthalidone, Estradiol with Prometrium. She was advised that we  will arrange an oncology consult as an  outpatient for a second opinion.      HMI/MEDQ  D:  07/17/2004  T:  07/17/2004  Job:  [id number]   cc:   Vania Rea. Jarold Motto, M.D. [facility]   Darius Bump, M.D.  [phone]  [place] [address]  Fax: [phone]

## 2010-08-15 NOTE — Assessment & Plan Note (Signed)
[facility]                         GASTROENTEROLOGY OFFICE NOTE   Hailey, Daniels                    MRN:          [id number]  DATE:03/31/2006                            DOB:          [birth year]    Hailey Daniels is a 65 year old white female with a stromal tumor of the  stomach diagnosed by Dr. Jarold Motto on upper endoscopy in February 2006.  She has undergone resection of the tumor by Dr. Derrell Lolling in February 2007  and has been followed by Dr. Mancel Bale with the protocol of Gleevec  which she completed in June 2007. She is currently asymptomatic. She is  still about 10 pounds under her usual weight. It has been almost a year  since the initial surgery and she is due for upper endoscopy to assess  healing of the stomach. She was also diagnosed with Barrett's esophagus  on upper endoscopy showing small focal distal metaplasia of the GE  junction. We have done a colonoscopy for neoplastic screening in 2000  which showed a small polyp. Histology of the tissue showed colonic  mucosa without adenomatous or hyperplastic changes. Her interval for  recall colonoscopy is 2010.   MEDICATIONS:  1. Zocor 20 mg p.o. daily.  2. Prometrium 100 mg p.o. daily.  3. Estradiol 0.5 mg daily.  4. AcipHex 20 mg p.o. b.i.d.  5. HCTZ 12.5 mg daily.   PHYSICAL EXAMINATION:  VITAL SIGNS:  Blood pressure 132/70, pulse 72 and  weight 143 pounds.  GENERAL:  She was alert and oriented in no distress.  LUNGS:  Clear to auscultation.  COR:  Normal S1, normal S2.  ABDOMEN:  Soft, nontender with a well-healed surgical scar in the  epigastrium. Liver edge at the costal margin overall percussion about 6  or 7 cm. Whole abdomen was normal, no palpable mass in left upper  quadrant.  RECTAL:  Not done.   IMPRESSION:  91. A 65 year old white female  with gastrointestinal stroma tumor      status post resection of chemotherapy with Gleevec currently doing      well. Dr.  Mancel Bale is following her with a regular CT scan of      the abdomen. She is due for repeat upper endoscopy. She has not had      one since the initial surgery.  2. History of normal colonoscopy with biopsy of presumed polyp not to      turn out to be actually a polyp but normal colonic mucosa.  3. Microscopic Barrett's esophagus on endoscopy February 2006,      currently on high dose proton pump inhibitors.   PLAN:  1. We will go ahead and schedule upper endoscopy. I believe the      biopsies will hopefully indicate that we will be able to decrease      her AcipHex to 1 a day or even to let her take only H2 receptor      antagonists depending on the findings and the biopsies.  2. Next colonoscopy in 2010.  3. Followup with Dr. Truett Perna for repeat CT scans at this time on a  yearly basis.     Hedwig Morton. Juanda Chance, MD  Electronically Signed    DMB/MedQ  DD: 03/31/2006  DT: 03/31/2006  Job #: [id number]   cc:   Darius Bump, M.D.  Angelia Mould. Derrell Lolling, M.D.  Leighton Roach. Truett Perna, M.D.

## 2010-10-13 ENCOUNTER — Encounter (HOSPITAL_BASED_OUTPATIENT_CLINIC_OR_DEPARTMENT_OTHER): Payer: BC Managed Care – PPO | Admitting: Oncology

## 2010-10-13 DIAGNOSIS — D481 Neoplasm of uncertain behavior of connective and other soft tissue: Secondary | ICD-10-CM

## 2011-08-26 ENCOUNTER — Other Ambulatory Visit: Payer: Self-pay | Admitting: Dermatology

## 2011-09-23 ENCOUNTER — Telehealth: Payer: Self-pay | Admitting: Oncology

## 2011-09-23 NOTE — Telephone Encounter (Signed)
lmonvm for pt re appt for 7/16. Schedule mailed.

## 2011-10-13 ENCOUNTER — Ambulatory Visit: Payer: BC Managed Care – PPO | Admitting: Oncology

## 2011-10-14 ENCOUNTER — Telehealth: Payer: Self-pay | Admitting: Oncology

## 2011-10-14 NOTE — Telephone Encounter (Signed)
Pt called wants to r/s missed appt on 7/16, appt r/s to August 2013 per pt rqst

## 2011-11-04 ENCOUNTER — Other Ambulatory Visit: Payer: Self-pay | Admitting: Internal Medicine

## 2011-11-06 ENCOUNTER — Encounter: Payer: Self-pay | Admitting: *Deleted

## 2011-11-06 DIAGNOSIS — C49A2 Gastrointestinal stromal tumor of stomach: Secondary | ICD-10-CM | POA: Insufficient documentation

## 2011-11-09 ENCOUNTER — Telehealth: Payer: Self-pay | Admitting: Oncology

## 2011-11-09 ENCOUNTER — Ambulatory Visit (HOSPITAL_BASED_OUTPATIENT_CLINIC_OR_DEPARTMENT_OTHER): Payer: BC Managed Care – PPO | Admitting: Oncology

## 2011-11-09 VITALS — BP 148/91 | HR 73 | Temp 98.3°F | Resp 18 | Ht 68.0 in | Wt 141.3 lb

## 2011-11-09 DIAGNOSIS — C49A2 Gastrointestinal stromal tumor of stomach: Secondary | ICD-10-CM

## 2011-11-09 DIAGNOSIS — K2270 Barrett's esophagus without dysplasia: Secondary | ICD-10-CM

## 2011-11-09 DIAGNOSIS — D481 Neoplasm of uncertain behavior of connective and other soft tissue: Secondary | ICD-10-CM

## 2011-11-09 NOTE — Telephone Encounter (Signed)
appts made and printed for pt aom °

## 2011-11-09 NOTE — Progress Notes (Signed)
   [facility]    OFFICE PROGRESS NOTE   INTERVAL HISTORY:   She returns as scheduled. She feels well. Good appetite and energy level. No difficulty with bowel function. No abdominal pain. No dysphagia. She feels as though there is something present at the upper pharynx. She thinks her may be sinus drainage.  Objective:  Vital signs in last 24 hours:  Blood pressure 148/91, pulse 73, temperature 98.3 F (36.8 C), temperature source Oral, resp. rate 18, height 5\' 8"  (1.727 m), weight 141 lb 4.8 oz (64.093 kg).    HEENT: Oropharynx without visible mass, the pharynx is clear, neck without mass Lymphatics: No cervical, supraclavicular, axillary, or inguinal nodes Resp: End inspiratory rhonchi at the extreme posterior base bilaterally, no respiratory distress Cardio: Regular rate and rhythm GI: No hepatosplenomegaly, no mass, nontender Vascular: No leg edema    Medications: I have reviewed the patient's current medications.  Assessment/Plan: 1. Gastrointestinal stromal tumor diagnosed in March 2006.  She remains in clinical remission.  She completed adjuvant Gleevec therapy on the ACOSOG study. 2. History of Barrett esophagus.  She continues followup with Dr. Juanda Chance. Last endoscopy in July of 2010.  3. Hypertension.    Disposition:  She remains in clinical remission from the gastrointestinal stromal tumor. She would like to continue followup at the cancer Center. She will return for an office visit in one year. She will followup with Dr. Juanda Chance for the abnormal feeling at the pharynx.   Thornton Papas, MD  11/09/2011  3:26 PM

## 2011-12-23 DIAGNOSIS — Z23 Encounter for immunization: Secondary | ICD-10-CM

## 2012-01-14 ENCOUNTER — Other Ambulatory Visit: Payer: Self-pay | Admitting: Dermatology

## 2012-03-04 ENCOUNTER — Other Ambulatory Visit: Payer: Self-pay | Admitting: *Deleted

## 2012-03-04 MED ORDER — FAMOTIDINE 40 MG PO TABS: ORAL_TABLET | ORAL | Status: DC

## 2012-06-15 ENCOUNTER — Other Ambulatory Visit: Payer: Self-pay | Admitting: Internal Medicine

## 2012-06-21 ENCOUNTER — Telehealth: Payer: Self-pay | Admitting: Internal Medicine

## 2012-06-21 MED ORDER — FAMOTIDINE 40 MG PO TABS: ORAL_TABLET | ORAL | Status: DC

## 2012-06-21 NOTE — Telephone Encounter (Signed)
rx sent

## 2012-06-23 ENCOUNTER — Encounter: Payer: Self-pay | Admitting: *Deleted

## 2012-08-15 ENCOUNTER — Ambulatory Visit: Payer: Self-pay | Admitting: Internal Medicine

## 2012-09-09 ENCOUNTER — Ambulatory Visit: Payer: Self-pay | Admitting: Internal Medicine

## 2012-10-11 ENCOUNTER — Encounter: Payer: Self-pay | Admitting: Internal Medicine

## 2012-10-11 ENCOUNTER — Ambulatory Visit (INDEPENDENT_AMBULATORY_CARE_PROVIDER_SITE_OTHER): Payer: BC Managed Care – PPO | Admitting: Internal Medicine

## 2012-10-11 VITALS — BP 136/92 | HR 68 | Ht 68.0 in | Wt 145.4 lb

## 2012-10-11 DIAGNOSIS — K2270 Barrett's esophagus without dysplasia: Secondary | ICD-10-CM

## 2012-10-11 DIAGNOSIS — D371 Neoplasm of uncertain behavior of stomach: Secondary | ICD-10-CM

## 2012-10-11 MED ORDER — FAMOTIDINE 40 MG PO TABS: 40.0000 mg | ORAL_TABLET | Freq: Two times a day (BID) | ORAL | Status: DC

## 2012-10-11 NOTE — Patient Instructions (Addendum)
We have sent the following medications to your pharmacy for you to pick up at your convenience: Pepcid 40 mg twice daily  You will be due for a recall endoscopy in 09/2013. We will send you a reminder in the mail when it gets closer to that time.  You will be due for a recall colonoscopy in 01/2017. We will send you a reminder in the mail when it gets closer to that time.  CC: Dr Guerry Bruin, Dr Truett Perna

## 2012-10-11 NOTE — Progress Notes (Signed)
SHALETHA HUMBLE 1945-05-01 MRN [id number]   History of Present Illness:  This is a 67 year old white female with a history of a gastrointestinal stromal tumor diagnosed in March 2006 and resected by Dr. Derrell Lolling. She completed adjuvant chemotherapy with Glevac on an ACOSOC study. She has remained asymptomatic. A followup endoscopy in 2006 showed Barrett's esophagus but there was no Barrett's on her 2008 and 2010 endoscopy. She has occasional scratchiness in the back of her throat and she is not sure whether it is postnasal drip or reflux. She denies dysphagia. She takes Pepcid 40 mg every morning. Her recall upper endoscopy is due for July 2015 and her recall colonoscopy is due in November 2018. Prior colonoscopies were in 2000 and again in November 2008.   Past Medical History  Diagnosis Date  . Gastrointestinal stromal tumor of stomach 2006  . Hypertension   . Hypercholesteremia   . Hiatal hernia   . Diverticulosis   . Barrett esophagus    Past Surgical History  Procedure Laterality Date  . Exploratory laparotomy  06/12/2004    resection of gastric tumor  . Breast biopsy Left     reports that she has never smoked. She has never used smokeless tobacco. She reports that  drinks alcohol. She reports that she does not use illicit drugs. family history includes Lung cancer in her mother and Other in her father.  There is no history of Colon cancer. No Known Allergies      Review of Systems: Negative for heartburn or dysphagia  The remainder of the 10 point ROS is negative except as outlined in H&P   Physical Exam: General appearance  Well developed, in no distress. Eyes- non icteric. HEENT nontraumatic, normocephalic. Mouth no lesions, tongue papillated, no cheilosis. Neck supple without adenopathy, thyroid not enlarged, no carotid bruits, no JVD. Lungs Clear to auscultation bilaterally. Cor normal S1, normal S2, regular rhythm, no murmur,  quiet precordium. Abdomen: Soft relaxed  abdomen with normoactive bowel sounds. No distention. No tenderness. Rectal: Soft Hemoccult negative stool. Extremities no pedal edema. Skin no lesions. Neurological alert and oriented x 3. Psychological normal mood and affect.  Assessment and Plan:  Problem #10 67 year old white female who had a successful resection of a gastric stromal tumor in March 2006. She remains asymptomatic. She is followed by Dr. Truett Perna.  Problem #2 Gastroesophageal reflux disease with an isolated diagnosis of Barrett's esophagus in 2006 which has not been reproduced on 2 subsequent endoscopies in 2008 and 2010. We will increase her Pepcid to 40 mg by mouth twice a day on a trial basis to see if her scratchiness and globus sensation will improve. She is due for an upper endoscopy in July 2015.   Problem #3 Colorectal screening. A recall colonoscopy will be due in July 2018.    10/11/2012 Lina Sar

## 2012-11-10 ENCOUNTER — Telehealth: Payer: Self-pay | Admitting: Oncology

## 2012-11-10 ENCOUNTER — Ambulatory Visit (HOSPITAL_BASED_OUTPATIENT_CLINIC_OR_DEPARTMENT_OTHER): Payer: BC Managed Care – PPO | Admitting: Oncology

## 2012-11-10 VITALS — BP 147/95 | HR 83 | Temp 97.7°F | Resp 19 | Ht 68.0 in | Wt 146.8 lb

## 2012-11-10 DIAGNOSIS — C49A2 Gastrointestinal stromal tumor of stomach: Secondary | ICD-10-CM

## 2012-11-10 DIAGNOSIS — D481 Neoplasm of uncertain behavior of connective and other soft tissue: Secondary | ICD-10-CM

## 2012-11-10 NOTE — Telephone Encounter (Signed)
gave pt appt for MD only 2015

## 2012-11-10 NOTE — Progress Notes (Signed)
   [facility]    OFFICE PROGRESS NOTE   INTERVAL HISTORY:   She returns as scheduled. She reports a chronic postnasal drip causing an unusual feeling at the pharynx . No dysphagia. No reflux symptoms. She feels well. No other complaint. She plans to have an endoscopy with Dr. Juanda Chance next year.  Objective:  Vital signs in last 24 hours:  Blood pressure 147/95, pulse 83, temperature 97.7 F (36.5 C), temperature source Oral, resp. rate 19, height 5\' 8"  (1.727 m), weight 146 lb 12.8 oz (66.588 kg).    HEENT: Neck without mass, oropharynx without visible mass Lymphatics: No cervical, supra-clavicular, axillary, or inguinal nodes Resp: Lungs clear bilaterally Cardio: Regular rate and rhythm GI: No hepatosplenomegaly, nontender, no mass, no apparent ascites Vascular: No leg edema    Medications: I have reviewed the patient's current medications.  Assessment/Plan: 1. Gastrointestinal stromal tumor diagnosed in March 2006. She remains in clinical remission. She completed adjuvant Gleevec therapy on the ACOSOG study. 2. History of Barretts esophagus. She continues followup with Dr. Juanda Chance. Last endoscopy in July of 2010. Due for an endoscopy in July of 2015. 3. Hypertension.   Disposition:  She remains in clinical remission from the gastrointestinal stromal tumor. She will return for an office visit in one year. She continues followup with Dr. Juanda Chance for the history of Barrett's esophagus and abnormal symptoms at the pharynx.   Thornton Papas, MD  11/10/2012  4:52 PM

## 2013-01-06 ENCOUNTER — Other Ambulatory Visit: Payer: Self-pay | Admitting: Dermatology

## 2013-03-09 ENCOUNTER — Telehealth: Payer: Self-pay | Admitting: Cardiology

## 2013-03-09 NOTE — Telephone Encounter (Signed)
New Problem:  Pt states she was told to call Katina Dung to set up an appt. Pt is a new pt and I offered her 04/06/13 in the afternoon. Pt is requesting a sooner appt and would like to speak with Thurston Hole. Pt states she is a friend of Dr. Shirlee Latch and his mother. Pt would like a call back.

## 2013-03-09 NOTE — Telephone Encounter (Signed)
Dr Shirlee Latch will see her Monday 03/13/13 at 8:45 AM. Please ask her to get all her records from her cardiologist, she dropped off a couple of reports from her cardiologist but Dr Shirlee Latch would like all her records.  Please call her to schedule and get records. Thanks.

## 2013-03-13 ENCOUNTER — Encounter: Payer: Self-pay | Admitting: *Deleted

## 2013-03-15 ENCOUNTER — Ambulatory Visit (INDEPENDENT_AMBULATORY_CARE_PROVIDER_SITE_OTHER): Payer: BC Managed Care – PPO | Admitting: Cardiology

## 2013-03-15 ENCOUNTER — Encounter: Payer: Self-pay | Admitting: Cardiology

## 2013-03-15 VITALS — BP 156/100 | HR 71 | Ht 68.0 in | Wt 145.0 lb

## 2013-03-15 DIAGNOSIS — E785 Hyperlipidemia, unspecified: Secondary | ICD-10-CM | POA: Insufficient documentation

## 2013-03-15 DIAGNOSIS — I428 Other cardiomyopathies: Secondary | ICD-10-CM

## 2013-03-15 DIAGNOSIS — I429 Cardiomyopathy, unspecified: Secondary | ICD-10-CM

## 2013-03-15 DIAGNOSIS — R9430 Abnormal result of cardiovascular function study, unspecified: Secondary | ICD-10-CM

## 2013-03-15 LAB — BASIC METABOLIC PANEL WITH GFR
BUN: 13 mg/dL (ref 6–23)
CO2: 30 meq/L (ref 19–32)
Calcium: 9.7 mg/dL (ref 8.4–10.5)
Chloride: 102 meq/L (ref 96–112)
Creatinine, Ser: 0.6 mg/dL (ref 0.4–1.2)
GFR: 115.02 mL/min
Glucose, Bld: 98 mg/dL (ref 70–99)
Potassium: 3.5 meq/L (ref 3.5–5.1)
Sodium: 140 meq/L (ref 135–145)

## 2013-03-15 MED ORDER — ATORVASTATIN CALCIUM 20 MG PO TABS: 20.0000 mg | ORAL_TABLET | Freq: Every day | ORAL | Status: DC

## 2013-03-15 NOTE — Progress Notes (Signed)
Patient ID: Hailey Daniels, female   DOB: 02-20-1946, 67 y.o.   MRN: [id number] PCP: Dr. Wylene Simmer  67 yo with history of HTN and newly-noted LBBB presents for cardiology evaluation after having a recent abnormal stress test.  Patient was noted by PCP on a recent ECG to have LBBB.  This was new compared to the most recent prior ECG, but apparently that was from back in 2006.  She needs to undergo surgery for a blocked tear duct as well as a deviated septum repair.  This will have to be done under general anesthesia.  She was sent to an outside site for a stress test.  This was reported to show EF 34% with inferior, inferoseptal, and and apical septal scar without ischemia.  She comes in today to discuss this stress test finding.   She has no exertional symptoms.  She has never had chest pain.  She walks 30-40 minutes briskly several times a week with no exertional dyspnea.  No lightheadedness, palpitations, or syncope.  No orthopnea or PND.  No ankle swelling or weight gain.  She has never smoked.    Labs (8/14): LDL 161, HDL 95, K 3.8, creatinine 0.7  ECG: NSR, LBBB  PMH: 1. Hyperlipidemia 2. LBBB 3. HTN 4. GI stromal tumor: Diagnosed in 3/06, treated with surgery and Gleevec chemotherapy. In remission.  5. Barrett's esophagus.  6. Hyperlipidemia 7. Cardiomyopathy: Lexiscan Cardiolite (12/14) with EF 34% and medium-large inferior, inferoseptal, and apical septal scar without ischemia.   SH: Nonsmoker, married, lives in [place].    FH: No CAD history.  No history of cardiomyopathy.   ROS: All systems reviewed and negative except as per HPI.   Current Outpatient Prescriptions  Medication Sig Dispense Refill  . atenolol (TENORMIN) 25 MG tablet Take 25 mg by mouth daily.      . famotidine (PEPCID) 40 MG tablet Take 1 tablet (40 mg total) by mouth 2 (two) times daily. Take 1 tablet by mouth once daily. MUST HAVE OFFICE VISIT FOR FURTHER REFILLS!  180 tablet  2  . hydrochlorothiazide  (HYDRODIURIL) 25 MG tablet Take 25 mg by mouth daily.      . potassium chloride (K-DUR) 10 MEQ tablet Take 10 mEq by mouth daily.      . timolol (TIMOPTIC-XR) 0.5 % ophthalmic gel-forming Apply 1 drop to eye Daily. Both eyes      . atorvastatin (LIPITOR) 20 MG tablet Take 1 tablet (20 mg total) by mouth daily.  30 tablet  3   No current facility-administered medications for this visit.    BP 156/100  Pulse 71  Ht 5\' 8"  (1.727 m)  Wt 65.772 kg (145 lb)  BMI 22.05 kg/m2 General: NAD Neck: No JVD, no thyromegaly or thyroid nodule.  Lungs: Clear to auscultation bilaterally with normal respiratory effort. CV: Nondisplaced PMI.  Heart regular S1/S2, paradoxical S2 split, no S3/S4, no murmur.  No peripheral edema.  No carotid bruit.  Normal pedal pulses.  Abdomen: Soft, nontender, no hepatosplenomegaly, no distention.  Skin: Intact without lesions or rashes.  Neurologic: Alert and oriented x 3.  Psych: Normal affect. Extremities: No clubbing or cyanosis.  HEENT: Normal.   Assessment/Plan: 1. Cardiomyopathy: EF 34% on Lexiscan Cardiolite with inferior/inferoseptal/apical septal fixed perfusion defect. No evidence for ischemia.  She has not had any symptoms suggestive of ischemia or of a prior MI.  NYHA class I with no evidence for volume overload.  Cardiolite suggests prior MI with ischemic cardiomyopathy, but patient has  a LBBB, and it is possible to get artifactual defects on Cardiolite in the setting of LBBB.  Additionally, it is possible to have a LBBB cardiomyopathy with decreased EF in the absence of coronary disease due to the conduction abnormality itself.  Our first step will be to determine whether EF is truly low or whether the EF from Cardiolite was artifactual due to LBBB.   I will arrange for a cardiac MRI.  This will also allow me to see if she has scar concerning for prior MI by assessing the delayed enhancement pattern.  Further workup will depend upon results of MRI.   2.  Hyperlipidemia: LDL is very high despite pravastatin.  I will have her take atorvastatin 20 mg daily instead.  Check lipids/LFTs in 2 months.   3. Preoperative evaluation:  No ischemia on stress test and no exertional dyspnea or chest pain.  Therefore, I think that she could undergo low risk surgery (which is being planned).    Followup in 2 wks.   Marca Ancona 03/15/2013

## 2013-03-15 NOTE — Patient Instructions (Signed)
Stop pravastatin.   Start atorvastatin 20mg  daily.  Your physician recommends that you have lab work today--BMET.  Your physician has requested that you have a cardiac MRI. Cardiac MRI uses a computer to create images of your heart as its beating, producing both still and moving pictures of your heart and major blood vessels. For further information please visit [email or web address]. Please follow the instruction sheet given to you today for more information.   Your physician recommends that you schedule a follow-up appointment in: 2 weeks with Dr Shirlee Latch.   Your physician recommends that you return for a FASTING lipid profile /liver profile in 2 months.

## 2013-03-21 ENCOUNTER — Ambulatory Visit (HOSPITAL_COMMUNITY)
Admission: RE | Admit: 2013-03-21 | Discharge: 2013-03-21 | Disposition: A | Discharge status: Home / Self Care | Payer: BC Managed Care – PPO | Source: Ambulatory Visit | Attending: Cardiology | Admitting: Cardiology

## 2013-03-21 DIAGNOSIS — I428 Other cardiomyopathies: Secondary | ICD-10-CM | POA: Insufficient documentation

## 2013-03-21 DIAGNOSIS — R9430 Abnormal result of cardiovascular function study, unspecified: Secondary | ICD-10-CM

## 2013-03-21 DIAGNOSIS — I447 Left bundle-branch block, unspecified: Secondary | ICD-10-CM | POA: Insufficient documentation

## 2013-03-21 MED ORDER — GADOBENATE DIMEGLUMINE 529 MG/ML IV SOLN
20.0000 mL | Freq: Once | INTRAVENOUS | Status: AC
  Administered 2013-03-21: 20 mL via INTRAVENOUS

## 2013-03-29 ENCOUNTER — Ambulatory Visit (INDEPENDENT_AMBULATORY_CARE_PROVIDER_SITE_OTHER): Payer: BC Managed Care – PPO | Admitting: Cardiology

## 2013-03-29 ENCOUNTER — Encounter: Payer: Self-pay | Admitting: Cardiology

## 2013-03-29 VITALS — BP 124/82 | HR 68 | Ht 68.0 in | Wt 144.0 lb

## 2013-03-29 DIAGNOSIS — I429 Cardiomyopathy, unspecified: Secondary | ICD-10-CM

## 2013-03-29 DIAGNOSIS — E785 Hyperlipidemia, unspecified: Secondary | ICD-10-CM

## 2013-03-29 DIAGNOSIS — I428 Other cardiomyopathies: Secondary | ICD-10-CM

## 2013-03-29 NOTE — Patient Instructions (Signed)
Your physician recommends that you continue on your current medications as directed. Please refer to the Current Medication list given to you today.   Your physician recommends that you schedule a follow-up appointment as needed  

## 2013-03-31 NOTE — Progress Notes (Signed)
Patient ID: Hailey Daniels, female   DOB: [birth year], 68 y.o.   MRN: [id number] PCP: Dr. Osborne Casco  68 yo with history of HTN and newly-noted LBBB presented for cardiology evaluation after having a recent abnormal stress test.  Patient was noted by PCP on a recent ECG to have LBBB.  This was new compared to the most recent prior ECG, but apparently that was from back in 2006.  She needs to undergo surgery for a blocked tear duct as well as a deviated septum repair.  This will have to be done under general anesthesia.  She was sent to an outside site for a stress test.  This was reported to show EF 34% with inferior, inferoseptal, and and apical septal scar without ischemia.    She has no exertional symptoms.  She has never had chest pain.  She walks 30-40 minutes briskly several times a week with no exertional dyspnea.  No lightheadedness, palpitations, or syncope.  No orthopnea or PND.  No ankle swelling or weight gain.  She has never smoked.    I had her do a cardiac MRI to confirm EF and to assess for any evidence for prior myocardial infarction (as Cardiolite suggested).  EF was calculated to be 55%, and there was no myocardial delayed enhancement so no evidence for prior MI.   Labs (8/14): LDL 161, HDL 95, K 3.8, creatinine 0.7  ECG: NSR, LBBB  PMH: 1. Hyperlipidemia 2. LBBB 3. HTN 4. GI stromal tumor: Diagnosed in 3/06, treated with surgery and Gleevec chemotherapy. In remission.  5. Barrett's esophagus.  6. Hyperlipidemia 7. Cardiomyopathy: Lexiscan Cardiolite (12/14) with EF 34% and medium-large inferior, inferoseptal, and apical septal scar without ischemia. Cardiac MRI (12/14) with EF 55%, no regional wall motion abnormalities, no delayed enhancement.  Suspect Cardiolite showed artifact.   SH: Nonsmoker, married, lives in [place].    FH: No CAD history.  No history of cardiomyopathy.   ROS: All systems reviewed and negative except as per HPI  Current Outpatient Prescriptions   Medication Sig Dispense Refill  . atenolol (TENORMIN) 25 MG tablet Take 25 mg by mouth daily.      Marland Kitchen atorvastatin (LIPITOR) 20 MG tablet Take 1 tablet (20 mg total) by mouth daily.  30 tablet  3  . famotidine (PEPCID) 40 MG tablet Take 1 tablet (40 mg total) by mouth 2 (two) times daily. Take 1 tablet by mouth once daily. MUST HAVE OFFICE VISIT FOR FURTHER REFILLS!  180 tablet  2  . hydrochlorothiazide (HYDRODIURIL) 25 MG tablet Take 25 mg by mouth daily.      . potassium chloride (K-DUR) 10 MEQ tablet Take 10 mEq by mouth daily.      . timolol (TIMOPTIC-XR) 0.5 % ophthalmic gel-forming Apply 1 drop to eye Daily. Both eyes       No current facility-administered medications for this visit.    BP 124/82  Pulse 68  Ht 5\' 8"  (1.727 m)  Wt 65.318 kg (144 lb)  BMI 21.90 kg/m2 General: NAD Neck: No JVD, no thyromegaly or thyroid nodule.  Lungs: Clear to auscultation bilaterally with normal respiratory effort. CV: Nondisplaced PMI.  Heart regular S1/S2, paradoxical S2 split, no S3/S4, no murmur.  No peripheral edema.  No carotid bruit.  Normal pedal pulses.  Abdomen: Soft, nontender, no hepatosplenomegaly, no distention.  Skin: Intact without lesions or rashes.  Neurologic: Alert and oriented x 3.  Psych: Normal affect. Extremities: No clubbing or cyanosis.    Assessment/Plan: 1. Cardiomyopathy:  EF 34% on Lexiscan Cardiolite with inferior/inferoseptal/apical septal fixed perfusion defect. No evidence for ischemia.  She has not had any symptoms suggestive of ischemia or of a prior MI.  NYHA class I with no evidence for volume overload.  Cardiolite suggested prior MI with ischemic cardiomyopathy, but patient has a LBBB, and it is possible to get artifactual defects on Cardiolite in the setting of LBBB.  Additionally, it is possible to have a LBBB cardiomyopathy with decreased EF in the absence of coronary disease due to the conduction abnormality itself.  I had her do a cardiac MRI to confirm EF  and to look for evidence for prior MI.  The cardiac MRI showed EF 55% (calculated) and there was no delayed enhancement, so no evidence for prior MI.  I suspect that the Cardiolite was inaccurate, likely with artifact from LBBB.  I do not think that further evaluation is necessary. Given the LBBB, would repeat echo in 2-3 years to make sure that cardiomyopathy does not develop. 2. Hyperlipidemia: LDL is very high despite pravastatin.  She has started atorvastatin instead. Lipids/LFTs in 2 months.  3. Preoperative evaluation:  No ischemia on stress test and no exertional dyspnea or chest pain.  Therefore, I think that she could undergo low risk surgery (which is being planned).    Loralie Champagne 03/31/2013

## 2013-05-16 ENCOUNTER — Other Ambulatory Visit: Payer: BC Managed Care – PPO

## 2013-06-27 ENCOUNTER — Other Ambulatory Visit (INDEPENDENT_AMBULATORY_CARE_PROVIDER_SITE_OTHER): Payer: BC Managed Care – PPO

## 2013-06-27 DIAGNOSIS — R9430 Abnormal result of cardiovascular function study, unspecified: Secondary | ICD-10-CM

## 2013-06-27 LAB — LIPID PANEL
CHOL/HDL RATIO: 3
Cholesterol: 213 mg/dL — ABNORMAL HIGH (ref 0–200)
HDL: 81.1 mg/dL (ref >39.00)
LDL CALC: 102 mg/dL — AB (ref 0–99)
TRIGLYCERIDES: 150 mg/dL — AB (ref 0.0–149.0)
VLDL: 30 mg/dL (ref 0.0–40.0)

## 2013-06-27 LAB — HEPATIC FUNCTION PANEL
ALBUMIN: 4 g/dL (ref 3.5–5.2)
ALT: 39 U/L — ABNORMAL HIGH (ref 0–35)
AST: 21 U/L (ref 0–37)
Alkaline Phosphatase: 51 U/L (ref 39–117)
BILIRUBIN DIRECT: 0.1 mg/dL (ref 0.0–0.3)
TOTAL PROTEIN: 7.2 g/dL (ref 6.0–8.3)
Total Bilirubin: 0.8 mg/dL (ref 0.3–1.2)

## 2013-07-19 ENCOUNTER — Encounter: Payer: Self-pay | Admitting: Internal Medicine

## 2013-07-25 ENCOUNTER — Other Ambulatory Visit: Payer: Self-pay | Admitting: Cardiology

## 2013-08-04 ENCOUNTER — Encounter: Payer: Self-pay | Admitting: Internal Medicine

## 2013-10-06 ENCOUNTER — Ambulatory Visit (AMBULATORY_SURGERY_CENTER): Payer: Self-pay

## 2013-10-06 VITALS — Ht 69.0 in | Wt 145.0 lb

## 2013-10-06 DIAGNOSIS — K2270 Barrett's esophagus without dysplasia: Secondary | ICD-10-CM

## 2013-10-06 NOTE — Progress Notes (Signed)
No allergies to eggs or soy No home oxygen No diet/weight loss meds No past problems with anesthesia No relatives with past problems with anesthesia  Has email  Emmi instructions given for EGD

## 2013-10-11 ENCOUNTER — Encounter: Payer: Self-pay | Admitting: Internal Medicine

## 2013-10-20 ENCOUNTER — Encounter: Payer: BC Managed Care – PPO | Admitting: Internal Medicine

## 2013-10-27 ENCOUNTER — Ambulatory Visit (AMBULATORY_SURGERY_CENTER): Payer: BC Managed Care – PPO | Admitting: Internal Medicine

## 2013-10-27 ENCOUNTER — Encounter: Payer: Self-pay | Admitting: Internal Medicine

## 2013-10-27 VITALS — BP 145/76 | HR 77 | Temp 98.4°F | Resp 17 | Ht 69.0 in | Wt 145.0 lb

## 2013-10-27 DIAGNOSIS — K219 Gastro-esophageal reflux disease without esophagitis: Secondary | ICD-10-CM

## 2013-10-27 DIAGNOSIS — K2990 Gastroduodenitis, unspecified, without bleeding: Secondary | ICD-10-CM

## 2013-10-27 DIAGNOSIS — C49A2 Gastrointestinal stromal tumor of stomach: Secondary | ICD-10-CM

## 2013-10-27 DIAGNOSIS — K2270 Barrett's esophagus without dysplasia: Secondary | ICD-10-CM

## 2013-10-27 DIAGNOSIS — D481 Neoplasm of uncertain behavior of connective and other soft tissue: Secondary | ICD-10-CM

## 2013-10-27 DIAGNOSIS — K2970 Gastritis, unspecified, without bleeding: Secondary | ICD-10-CM

## 2013-10-27 MED ORDER — SODIUM CHLORIDE 0.9 % IV SOLN: 500.0000 mL | INTRAVENOUS | Status: DC

## 2013-10-27 NOTE — Op Note (Signed)
[facility]  [address]. [place], [id number]   ENDOSCOPY PROCEDURE REPORT  PATIENT: Hailey Daniels, Hailey Daniels  MR#: [id number] BIRTHDATE: 07-Sep-1945 , 67  yrs. old GENDER: Female ENDOSCOPIST: Lafayette Dragon, MD REFERRED BY:  Domenick Gong, M.D. PROCEDURE DATE:  10/27/2013 PROCEDURE:  EGD w/ biopsy ASA CLASS:     Class II INDICATIONS:  Gastric stromal tumor removed in 2006.  Barrett's esophagus in 2006.  Prior endoscopies in 2008 and July 2000 and no evidence of recurrent tumor.  No Barrett's esophagus on last EGD. MEDICATIONS: MAC sedation, administered by CRNA and propofol (Diprivan) 200mg  IV TOPICAL ANESTHETIC: none  DESCRIPTION OF PROCEDURE: After the risks benefits and alternatives of the procedure were thoroughly explained, informed consent was obtained.  The LB [id number] endoscope was introduced through the mouth and advanced to the second portion of the duodenum. Without limitations.  The instrument was slowly withdrawn as the mucosa was fully examined.      Esophagus: proximal and mid esophageal mucosa was normal. There was a short erosion at the GE junction which was consistent with the grade 1 esophagitis. There was no stricture. Biopsies were obtained to rule out Barrett's esophagus. There was a small 2 cm hiatal hernia distal to the GE junction Stomach: Small 5 mm sessile polyp in the gastric fundus which was removed with cold biopsies. There was a stellate  scar in the gastric antrum left after resection of gastric stromal tumor. The antrum was mildly deformed due to prior surgery. The gastric outlet was normal. Gastric body was normal. Biopsies were taken from the scar to rule out dysplasia Duodenum: duodenal bulb and descending duodenum was normal[ The scope was then withdrawn from the patient and the procedure completed.  COMPLICATIONS: There were no complications. ENDOSCOPIC IMPRESSION:  grade 1 esophagitis Small hiatal  hernia Status post remote resection of a gastric stromal tumor with no endoscopic evidence of recurrence. This was biopsies of the scar  RECOMMENDATIONS: 1.  Await biopsy results 2.   Antireflux measures Acid reducing regimen Recall upper endoscopy pending biopsy results  REPEAT EXAM: for EGD pending biopsy results.  eSigned:  Lafayette Dragon, MD 10/27/2013 12:17 PM   CC:  PATIENT NAME:  [name], [name] MR#: [id number]

## 2013-10-27 NOTE — Progress Notes (Signed)
A/ox3 pleased with MAC, report to Sheila RN 

## 2013-10-27 NOTE — Progress Notes (Signed)
Called to room to assist during endoscopic procedure.  Patient ID and intended procedure confirmed with present staff. Received instructions for my participation in the procedure from the performing physician.  

## 2013-10-27 NOTE — Patient Instructions (Signed)
YOU HAD AN ENDOSCOPIC PROCEDURE TODAY AT THE [facility]: Refer to the procedure report that was given to you for any specific questions about what was found during the examination.  If the procedure report does not answer your questions, please call your gastroenterologist to clarify.  If you requested that your care partner not be given the details of your procedure findings, then the procedure report has been included in a sealed envelope for you to review at your convenience later.  YOU SHOULD EXPECT: Some feelings of bloating in the abdomen. Passage of more gas than usual.  Walking can help get rid of the air that was put into your GI tract during the procedure and reduce the bloating. If you had a lower endoscopy (such as a colonoscopy or flexible sigmoidoscopy) you may notice spotting of blood in your stool or on the toilet paper. If you underwent a bowel prep for your procedure, then you may not have a normal bowel movement for a few days.  DIET: Your first meal following the procedure should be a light meal and then it is ok to progress to your normal diet.  A half-sandwich or bowl of soup is an example of a good first meal.  Heavy or fried foods are harder to digest and may make you feel nauseous or bloated.  Likewise meals heavy in dairy and vegetables can cause extra gas to form and this can also increase the bloating.  Drink plenty of fluids but you should avoid alcoholic beverages for 24 hours.  ACTIVITY: Your care partner should take you home directly after the procedure.  You should plan to take it easy, moving slowly for the rest of the day.  You can resume normal activity the day after the procedure however you should NOT DRIVE or use heavy machinery for 24 hours (because of the sedation medicines used during the test).    SYMPTOMS TO REPORT IMMEDIATELY: A gastroenterologist can be reached at any hour.  During normal business hours, 8:30 AM to 5:00 PM Monday through Friday,  call [phone].  After hours and on weekends, please call the GI answering service at [phone] who will take a message and have the physician on call contact you.    Following upper endoscopy (EGD)  Vomiting of blood or coffee ground material  New chest pain or pain under the shoulder blades  Painful or persistently difficult swallowing  New shortness of breath  Fever of 100F or higher  Black, tarry-looking stools  FOLLOW UP: If any biopsies were taken you will be contacted by phone or by letter within the next 1-3 weeks.  Call your gastroenterologist if you have not heard about the biopsies in 3 weeks.  Our staff will call the home number listed on your records the next business day following your procedure to check on you and address any questions or concerns that you may have at that time regarding the information given to you following your procedure. This is a courtesy call and so if there is no answer at the home number and we have not heard from you through the emergency physician on call, we will assume that you have returned to your regular daily activities without incident.  SIGNATURES/CONFIDENTIALITY: You and/or your care partner have signed paperwork which will be entered into your electronic medical record.  These signatures attest to the fact that that the information above on your After Visit Summary has been reviewed and is understood.  Full   responsibility of the confidentiality of this discharge information lies with you and/or your care-partner.   Resume medications. Information given on esophagitis and hiatal hernia with discharge instructions.

## 2013-10-30 ENCOUNTER — Telehealth: Payer: Self-pay

## 2013-10-30 NOTE — Telephone Encounter (Signed)
LEFT MESSAGE ON ANSWERING MACHINE. 

## 2013-11-01 ENCOUNTER — Encounter: Payer: Self-pay | Admitting: *Deleted

## 2013-11-01 ENCOUNTER — Encounter: Payer: Self-pay | Admitting: Internal Medicine

## 2013-11-13 ENCOUNTER — Ambulatory Visit: Payer: BC Managed Care – PPO | Admitting: Oncology

## 2013-11-17 ENCOUNTER — Encounter: Payer: Self-pay | Admitting: Internal Medicine

## 2013-11-17 ENCOUNTER — Encounter: Payer: Self-pay | Admitting: Gastroenterology

## 2013-11-24 ENCOUNTER — Telehealth: Payer: Self-pay | Admitting: Oncology

## 2013-11-24 NOTE — Telephone Encounter (Signed)
s.w. pt and r/s appt againg...done pt aware of new d.t

## 2013-11-24 NOTE — Telephone Encounter (Signed)
pt called to r/s appt..done...pt aware of new d.t °

## 2013-11-27 ENCOUNTER — Other Ambulatory Visit: Payer: Self-pay | Admitting: *Deleted

## 2013-11-27 ENCOUNTER — Other Ambulatory Visit: Payer: Self-pay | Admitting: Cardiology

## 2013-11-27 MED ORDER — FAMOTIDINE 40 MG PO TABS: 40.0000 mg | ORAL_TABLET | Freq: Two times a day (BID) | ORAL | Status: DC

## 2013-11-28 ENCOUNTER — Ambulatory Visit: Payer: BC Managed Care – PPO | Admitting: Oncology

## 2013-12-25 ENCOUNTER — Ambulatory Visit: Payer: BC Managed Care – PPO | Admitting: Oncology

## 2014-01-02 ENCOUNTER — Ambulatory Visit (HOSPITAL_BASED_OUTPATIENT_CLINIC_OR_DEPARTMENT_OTHER): Payer: BC Managed Care – PPO | Admitting: Oncology

## 2014-01-02 ENCOUNTER — Telehealth: Payer: Self-pay | Admitting: Oncology

## 2014-01-02 VITALS — BP 142/93 | HR 65 | Temp 98.2°F | Resp 18 | Ht 69.0 in | Wt 145.0 lb

## 2014-01-02 DIAGNOSIS — Z8603 Personal history of neoplasm of uncertain behavior: Secondary | ICD-10-CM

## 2014-01-02 DIAGNOSIS — C49A2 Gastrointestinal stromal tumor of stomach: Secondary | ICD-10-CM

## 2014-01-02 NOTE — Progress Notes (Signed)
  [place] OFFICE PROGRESS NOTE   Diagnosis: Gastrointestinal stromal tumor  INTERVAL HISTORY:   She returns as scheduled. She feels well. No dysphagia, reflux symptoms, or abdominal pain. Good appetite and energy level. She underwent an upper endoscopy 10/27/2013. There was mild esophagitis at the GE junction. A 5 mm polyp was removed from the gastric fundus. Stellate scar in the gastric antrum. Biopsies were taken from the scar. The pathology revealed mild chronic active gastritis at the antrum scar, the fundus polyp returned as a hyperplastic polyp, and a biopsy from the GE junction revealed mild inflammation consistent with gastroesophageal reflux. No evidence of tablet cell metaplasia, dysplasia, or malignancy.  Objective:  Vital signs in last 24 hours:  Blood pressure 142/93, pulse 65, temperature 98.2 F (36.8 C), temperature source Oral, resp. rate 18, height 5\' 9"  (1.753 m), weight 145 lb (65.772 kg), SpO2 98.00%.    HEENT: Neck without mass Lymphatics: No cervical, supra-clavicular, axillary, or inguinal nodes Resp: Lungs clear bilaterally Cardio: Regular rate and rhythm GI: No hepatosplenomegaly, nontender, no mass Vascular: No leg edema  Medications: I have reviewed the patient's current medications.  Assessment/Plan: 1. Gastrointestinal stromal tumor diagnosed in March 2006. She remains in clinical remission. She completed adjuvant Gleevec therapy on the ACOSOG study. 2. History of Barretts esophagus. She continues followup with Dr. Olevia Perches. Last endoscopy 10/27/2013 with no evidence of Barretts esophagus or recurrent gastrointestinal stromal tumor. 3. Hypertension.  Disposition:  Mr. Galligan remains in clinical remission from the gastrointestinal stromal tumor. She would like to continue followup at the [facility]. She will return for an office visit in one year.  Betsy Coder, MD  01/02/2014  10:32 AM

## 2014-01-02 NOTE — Telephone Encounter (Signed)
, °

## 2014-01-29 ENCOUNTER — Encounter: Payer: Self-pay | Admitting: Internal Medicine

## 2014-05-07 ENCOUNTER — Other Ambulatory Visit: Payer: Self-pay | Admitting: Cardiology

## 2014-05-09 ENCOUNTER — Other Ambulatory Visit: Payer: Self-pay

## 2014-05-09 MED ORDER — ATORVASTATIN CALCIUM 20 MG PO TABS: 20.0000 mg | ORAL_TABLET | Freq: Every day | ORAL | Status: DC

## 2014-09-27 ENCOUNTER — Other Ambulatory Visit: Payer: Self-pay | Admitting: Cardiology

## 2014-12-07 ENCOUNTER — Telehealth: Payer: Self-pay | Admitting: Oncology

## 2014-12-07 NOTE — Telephone Encounter (Signed)
Returned Advertising account executive. Patient confirmed appointment moved from 10/06 to 10/017

## 2014-12-31 ENCOUNTER — Telehealth: Payer: Self-pay

## 2014-12-31 NOTE — Telephone Encounter (Signed)
Pt wants Rx refill for Pepcid 40mg . Called to have patient pick a new Dr. Left vm for pt to call back

## 2015-01-03 ENCOUNTER — Ambulatory Visit: Payer: BC Managed Care – PPO | Admitting: Oncology

## 2015-01-03 NOTE — Telephone Encounter (Signed)
Pt states she is out of town and she will call back once she's home.

## 2015-01-10 DIAGNOSIS — R8290 Unspecified abnormal findings in urine: Secondary | ICD-10-CM | POA: Diagnosis not present

## 2015-01-10 DIAGNOSIS — I10 Essential (primary) hypertension: Secondary | ICD-10-CM | POA: Diagnosis not present

## 2015-01-10 DIAGNOSIS — R7301 Impaired fasting glucose: Secondary | ICD-10-CM | POA: Diagnosis not present

## 2015-01-10 DIAGNOSIS — N390 Urinary tract infection, site not specified: Secondary | ICD-10-CM | POA: Diagnosis not present

## 2015-01-11 ENCOUNTER — Telehealth: Payer: Self-pay | Admitting: Oncology

## 2015-01-11 NOTE — Telephone Encounter (Signed)
Returned patient call re r/s 10/17 appointment. Left message for patient that next available will not be until mid November and to call back if she still wants to r/s.

## 2015-01-11 NOTE — Telephone Encounter (Signed)
Returned patient call re r/s appointment. Moved 10/17 f/u to 11/15 per patient. Patient has new date/time.

## 2015-01-14 ENCOUNTER — Ambulatory Visit: Payer: Self-pay | Admitting: Oncology

## 2015-01-14 DIAGNOSIS — D485 Neoplasm of uncertain behavior of skin: Secondary | ICD-10-CM | POA: Diagnosis not present

## 2015-01-14 DIAGNOSIS — L570 Actinic keratosis: Secondary | ICD-10-CM | POA: Diagnosis not present

## 2015-01-14 DIAGNOSIS — L821 Other seborrheic keratosis: Secondary | ICD-10-CM | POA: Diagnosis not present

## 2015-01-14 DIAGNOSIS — Z85828 Personal history of other malignant neoplasm of skin: Secondary | ICD-10-CM | POA: Diagnosis not present

## 2015-01-15 ENCOUNTER — Other Ambulatory Visit: Payer: Self-pay | Admitting: Cardiology

## 2015-01-15 DIAGNOSIS — I10 Essential (primary) hypertension: Secondary | ICD-10-CM | POA: Diagnosis not present

## 2015-01-15 DIAGNOSIS — N952 Postmenopausal atrophic vaginitis: Secondary | ICD-10-CM | POA: Diagnosis not present

## 2015-01-15 DIAGNOSIS — C494 Malignant neoplasm of connective and soft tissue of abdomen: Secondary | ICD-10-CM | POA: Diagnosis not present

## 2015-01-15 DIAGNOSIS — Z23 Encounter for immunization: Secondary | ICD-10-CM | POA: Diagnosis not present

## 2015-01-15 DIAGNOSIS — K208 Other esophagitis: Secondary | ICD-10-CM | POA: Diagnosis not present

## 2015-01-15 DIAGNOSIS — I447 Left bundle-branch block, unspecified: Secondary | ICD-10-CM | POA: Diagnosis not present

## 2015-01-15 DIAGNOSIS — Z0000 Encounter for general adult medical examination without abnormal findings: Secondary | ICD-10-CM | POA: Diagnosis not present

## 2015-01-15 DIAGNOSIS — Z79890 Hormone replacement therapy (postmenopausal): Secondary | ICD-10-CM | POA: Diagnosis not present

## 2015-01-15 DIAGNOSIS — J302 Other seasonal allergic rhinitis: Secondary | ICD-10-CM | POA: Diagnosis not present

## 2015-01-15 DIAGNOSIS — Z6820 Body mass index (BMI) 20.0-20.9, adult: Secondary | ICD-10-CM | POA: Diagnosis not present

## 2015-01-15 DIAGNOSIS — N951 Menopausal and female climacteric states: Secondary | ICD-10-CM | POA: Diagnosis not present

## 2015-01-15 DIAGNOSIS — R7301 Impaired fasting glucose: Secondary | ICD-10-CM | POA: Diagnosis not present

## 2015-01-15 DIAGNOSIS — E7800 Pure hypercholesterolemia, unspecified: Secondary | ICD-10-CM | POA: Diagnosis not present

## 2015-01-15 DIAGNOSIS — Z1389 Encounter for screening for other disorder: Secondary | ICD-10-CM | POA: Diagnosis not present

## 2015-01-15 DIAGNOSIS — Z124 Encounter for screening for malignant neoplasm of cervix: Secondary | ICD-10-CM | POA: Diagnosis not present

## 2015-01-15 NOTE — Telephone Encounter (Signed)
Patient last seen 2014, but is prn follow up. Ok to refill or defer to pcp? Please advise. Thanks, MI

## 2015-02-12 ENCOUNTER — Ambulatory Visit (HOSPITAL_BASED_OUTPATIENT_CLINIC_OR_DEPARTMENT_OTHER): Payer: Medicare Other | Admitting: Oncology

## 2015-02-12 ENCOUNTER — Telehealth: Payer: Self-pay | Admitting: Oncology

## 2015-02-12 VITALS — BP 131/85 | HR 65 | Temp 98.0°F | Resp 18 | Ht 69.0 in | Wt 145.8 lb

## 2015-02-12 DIAGNOSIS — C49A2 Gastrointestinal stromal tumor of stomach: Secondary | ICD-10-CM | POA: Diagnosis not present

## 2015-02-12 NOTE — Telephone Encounter (Signed)
7GAVE AND PRINTED APPT SCHED AND AVS FOR PT FOR nov 2017

## 2015-02-12 NOTE — Progress Notes (Signed)
  [place] OFFICE PROGRESS NOTE   Diagnosis: Gastrointestinal stromal tumor.  INTERVAL HISTORY:   Hailey Daniels returns as scheduled. She feels well. Good appetite and energy level. No complaint.  Objective:  Vital signs in last 24 hours:  Blood pressure 131/85, pulse 65, temperature 98 F (36.7 C), temperature source Oral, resp. rate 18, height 5\' 9"  (1.753 m), weight 145 lb 12.8 oz (66.134 kg), SpO2 100 %.    HEENT: Neck without mass Lymphatics: No cervical, supraclavicular, axillary, or inguinal nodes Resp: Lungs clear bilaterally Cardio: Regular rate and rhythm GI: No hepatosplenomegaly, no apparent ascites, nontender, no mass Vascular: No leg edema   Medications: I have reviewed the patient's current medications.  Assessment/Plan: 1. Gastrointestinal stromal tumor diagnosed in March 2006. She remains in clinical remission. She completed adjuvant Gleevec therapy on the ACOSOG study. 2. History of Barretts esophagus. Last endoscopy 10/27/2013 with no evidence of Barretts esophagus or recurrent gastrointestinal stromal tumor. 3. Hypertension.   Disposition:  Hailey Daniels remains in clinical remission from the gastrointestinal stromal tumor. She would like to continue follow-up at the [facility]. She will return for an office visit in one year. She will contact gastroenterology to establish care with a new physician since Dr. Olevia Perches has retired.  Betsy Coder, MD  02/12/2015  1:45 PM

## 2015-04-19 ENCOUNTER — Other Ambulatory Visit: Payer: Self-pay | Admitting: Emergency Medicine

## 2015-04-19 MED ORDER — FAMOTIDINE 40 MG PO TABS: 40.0000 mg | ORAL_TABLET | Freq: Every day | ORAL | Status: DC

## 2015-04-19 NOTE — Telephone Encounter (Signed)
Patient has follow up scheduled with Nandigam in March. Rx request received for Pepcid. Refills sent in to last patient until appt.

## 2015-05-08 DIAGNOSIS — L218 Other seborrheic dermatitis: Secondary | ICD-10-CM | POA: Diagnosis not present

## 2015-05-08 DIAGNOSIS — L718 Other rosacea: Secondary | ICD-10-CM | POA: Diagnosis not present

## 2015-05-08 DIAGNOSIS — L570 Actinic keratosis: Secondary | ICD-10-CM | POA: Diagnosis not present

## 2015-05-08 DIAGNOSIS — L821 Other seborrheic keratosis: Secondary | ICD-10-CM | POA: Diagnosis not present

## 2015-05-08 DIAGNOSIS — Z85828 Personal history of other malignant neoplasm of skin: Secondary | ICD-10-CM | POA: Diagnosis not present

## 2015-06-04 ENCOUNTER — Other Ambulatory Visit (HOSPITAL_COMMUNITY): Payer: Self-pay | Admitting: *Deleted

## 2015-06-04 MED ORDER — ATORVASTATIN CALCIUM 20 MG PO TABS: 20.0000 mg | ORAL_TABLET | Freq: Every day | ORAL | Status: DC

## 2015-06-14 ENCOUNTER — Ambulatory Visit: Payer: Medicare Other | Admitting: Gastroenterology

## 2015-06-24 DIAGNOSIS — H5213 Myopia, bilateral: Secondary | ICD-10-CM | POA: Diagnosis not present

## 2015-06-24 DIAGNOSIS — H401111 Primary open-angle glaucoma, right eye, mild stage: Secondary | ICD-10-CM | POA: Diagnosis not present

## 2015-06-24 DIAGNOSIS — H02052 Trichiasis without entropian right lower eyelid: Secondary | ICD-10-CM | POA: Diagnosis not present

## 2015-06-24 DIAGNOSIS — H401122 Primary open-angle glaucoma, left eye, moderate stage: Secondary | ICD-10-CM | POA: Diagnosis not present

## 2015-07-26 ENCOUNTER — Ambulatory Visit: Payer: Medicare Other | Admitting: Gastroenterology

## 2015-09-25 DIAGNOSIS — Z1231 Encounter for screening mammogram for malignant neoplasm of breast: Secondary | ICD-10-CM | POA: Diagnosis not present

## 2015-12-05 ENCOUNTER — Ambulatory Visit: Payer: Medicare Other | Admitting: Gastroenterology

## 2015-12-17 DIAGNOSIS — L821 Other seborrheic keratosis: Secondary | ICD-10-CM | POA: Diagnosis not present

## 2015-12-17 DIAGNOSIS — L570 Actinic keratosis: Secondary | ICD-10-CM | POA: Diagnosis not present

## 2015-12-17 DIAGNOSIS — L603 Nail dystrophy: Secondary | ICD-10-CM | POA: Diagnosis not present

## 2015-12-17 DIAGNOSIS — D2261 Melanocytic nevi of right upper limb, including shoulder: Secondary | ICD-10-CM | POA: Diagnosis not present

## 2015-12-17 DIAGNOSIS — Z85828 Personal history of other malignant neoplasm of skin: Secondary | ICD-10-CM | POA: Diagnosis not present

## 2015-12-27 DIAGNOSIS — H40013 Open angle with borderline findings, low risk, bilateral: Secondary | ICD-10-CM | POA: Diagnosis not present

## 2016-01-20 ENCOUNTER — Ambulatory Visit (INDEPENDENT_AMBULATORY_CARE_PROVIDER_SITE_OTHER): Payer: Medicare Other | Admitting: Gastroenterology

## 2016-01-20 ENCOUNTER — Encounter: Payer: Self-pay | Admitting: Gastroenterology

## 2016-01-20 VITALS — BP 140/80 | HR 64 | Ht 69.0 in | Wt 145.1 lb

## 2016-01-20 DIAGNOSIS — C49A2 Gastrointestinal stromal tumor of stomach: Secondary | ICD-10-CM

## 2016-01-20 DIAGNOSIS — K219 Gastro-esophageal reflux disease without esophagitis: Secondary | ICD-10-CM | POA: Diagnosis not present

## 2016-01-20 MED ORDER — FAMOTIDINE 40 MG PO TABS: 40.0000 mg | ORAL_TABLET | Freq: Every day | ORAL | 12 refills | Status: DC

## 2016-01-20 NOTE — Patient Instructions (Signed)
We will send in your pepcid refills to your pharmacy  Your recall colonoscopy is in 01/2017

## 2016-01-21 NOTE — Progress Notes (Signed)
Hailey Daniels    [id number]    03/25/1946  Primary Care Physician:Richard Sandrea Hughs, MD  Referring Physician: Haywood Pao, MD [address] [place], [place] [address]  Chief complaint:  GERD  HPI:  70 year old white female with a history of a gastrointestinal stromal tumor diagnosed in March 2006 and resected by Dr. Dalbert Batman. She completed adjuvant chemotherapy with Glevac currently in remission follows with Dr. Malachy Mood annually EGD in 2006 showed irregular Z line with biopsies positive for Barrett's esophagus but there was no Barrett's on her 2008 and 2010 endoscopy. She has occasional hoarseness of voice and heartburn currently well controlled with Pepcid at bedtime.  She denies dysphagia. Recall colonoscopy is due in November 2018. Prior colonoscopies were in 2000 and again in November 2008 were normal. Denies any nausea, vomiting, abdominal pain, melena or bright red blood per rectum     Outpatient Encounter Prescriptions as of 01/20/2016  Medication Sig  . aspirin 81 MG tablet Take 81 mg by mouth daily.  Marland Kitchen atenolol (TENORMIN) 25 MG tablet Take 25 mg by mouth daily.  Marland Kitchen atorvastatin (LIPITOR) 20 MG tablet Take 1 tablet (20 mg total) by mouth daily.  . famotidine (PEPCID) 40 MG tablet Take 1 tablet (40 mg total) by mouth daily.  . hydrochlorothiazide (HYDRODIURIL) 25 MG tablet Take 25 mg by mouth daily.  . potassium chloride (K-DUR) 10 MEQ tablet Take 10 mEq by mouth daily.  . timolol (TIMOPTIC-XR) 0.5 % ophthalmic gel-forming Apply 1 drop to eye Daily. Both eyes  . [DISCONTINUED] famotidine (PEPCID) 40 MG tablet Take 1 tablet (40 mg total) by mouth daily.   No facility-administered encounter medications on file as of 01/20/2016.     Allergies as of 01/20/2016  . (No Known Allergies)    Past Medical History:  Diagnosis Date  . Barrett esophagus   . Diverticulosis   . Gastrointestinal stromal tumor of stomach ([place]) 2006  . Hiatal hernia   .  Hypercholesteremia   . Hypertension     Past Surgical History:  Procedure Laterality Date  . BREAST BIOPSY Left   . EXPLORATORY LAPAROTOMY  06/12/2004   resection of gastric tumor    Family History  Problem Relation Age of Onset  . Colon cancer Neg Hx   . Pancreatic cancer Neg Hx   . Stomach cancer Neg Hx   . Rectal cancer Neg Hx   . Lung cancer Mother   . Other Father     brain tumor    Social History   Social History  . Marital status: Married    Spouse name: N/A  . Number of children: 3  . Years of education: N/A   Occupational History  . retired    Social History Main Topics  . Smoking status: Never Smoker  . Smokeless tobacco: Never Used  . Alcohol use 4.2 oz/week    7 Glasses of wine per week  . Drug use: No  . Sexual activity: Not on file   Other Topics Concern  . Not on file   Social History Narrative  . No narrative on file      Review of systems: Review of Systems  Constitutional: Negative for fever and chills.  HENT: Negative.   Eyes: Negative for blurred vision.  Respiratory: Negative for cough, shortness of breath and wheezing.   Cardiovascular: Negative for chest pain and palpitations.  Gastrointestinal: as per HPI Genitourinary: Negative for dysuria, urgency, frequency and  hematuria.  Musculoskeletal: Negative for myalgias, back pain and joint pain.  Skin: Negative for itching and rash.  Neurological: Negative for dizziness, tremors, focal weakness, seizures and loss of consciousness.  Endo/Heme/Allergies: Positive for seasonal allergies.  Psychiatric/Behavioral: Negative for depression, suicidal ideas and hallucinations.  All other systems reviewed and are negative.   Physical Exam: Vitals:   01/20/16 1003  BP: 140/80  Pulse: 64   Body mass index is 21.43 kg/m. Gen:      No acute distress HEENT:  EOMI, sclera anicteric Neck:     No masses; no thyromegaly Lungs:    Clear to auscultation bilaterally; normal respiratory  effort CV:         Regular rate and rhythm; no murmurs Abd:      + bowel sounds; soft, non-tender; no palpable masses, no distension Ext:    No edema; adequate peripheral perfusion Skin:      Warm and dry; no rash Neuro: alert and oriented x 3 Psych: normal mood and affect  Data Reviewed:  Reviewed labs, radiology imaging, old records and pertinent past GI work up   Assessment and Plan/Recommendations: 70 year old female with history of gastric stromal tumor status post resection in 2006 and adjuvant chemotherapy with Gleevac, chronic GERD is here for follow up visit GERD symptoms currently well controlled on H2 blocker Pepcid at bedtime Continue to follow antireflux measures Reviewed independently past EGD reports and biopsy pathology results, no evidence of Barrett's on 2008 and 2010. Do not recommend surveillance EGD at this point based on current guidelines Return in 1 year or sooner if needed  25 minutes was spent face-to-face with the patient. Greater than 50% of the time used for counseling as well as treatment plan and follow-up. She had multiple questions which were answered to her satisfaction  K. Denzil Magnuson , MD [phone] Mon-Fri 8a-5p [phone] after 5p, weekends, holidays  CC: Tisovec, Fransico Him, MD

## 2016-01-23 DIAGNOSIS — E7800 Pure hypercholesterolemia, unspecified: Secondary | ICD-10-CM | POA: Diagnosis not present

## 2016-01-23 DIAGNOSIS — I10 Essential (primary) hypertension: Secondary | ICD-10-CM | POA: Diagnosis not present

## 2016-01-23 DIAGNOSIS — R7301 Impaired fasting glucose: Secondary | ICD-10-CM | POA: Diagnosis not present

## 2016-01-23 DIAGNOSIS — N390 Urinary tract infection, site not specified: Secondary | ICD-10-CM | POA: Diagnosis not present

## 2016-01-23 DIAGNOSIS — R8299 Other abnormal findings in urine: Secondary | ICD-10-CM | POA: Diagnosis not present

## 2016-01-30 DIAGNOSIS — K208 Other esophagitis: Secondary | ICD-10-CM | POA: Diagnosis not present

## 2016-01-30 DIAGNOSIS — Z23 Encounter for immunization: Secondary | ICD-10-CM | POA: Diagnosis not present

## 2016-01-30 DIAGNOSIS — Z79890 Hormone replacement therapy (postmenopausal): Secondary | ICD-10-CM | POA: Diagnosis not present

## 2016-01-30 DIAGNOSIS — Z0000 Encounter for general adult medical examination without abnormal findings: Secondary | ICD-10-CM | POA: Diagnosis not present

## 2016-01-30 DIAGNOSIS — E7800 Pure hypercholesterolemia, unspecified: Secondary | ICD-10-CM | POA: Diagnosis not present

## 2016-01-30 DIAGNOSIS — R7301 Impaired fasting glucose: Secondary | ICD-10-CM | POA: Diagnosis not present

## 2016-01-30 DIAGNOSIS — Z1389 Encounter for screening for other disorder: Secondary | ICD-10-CM | POA: Diagnosis not present

## 2016-01-30 DIAGNOSIS — I447 Left bundle-branch block, unspecified: Secondary | ICD-10-CM | POA: Diagnosis not present

## 2016-01-30 DIAGNOSIS — J302 Other seasonal allergic rhinitis: Secondary | ICD-10-CM | POA: Diagnosis not present

## 2016-01-30 DIAGNOSIS — Z6820 Body mass index (BMI) 20.0-20.9, adult: Secondary | ICD-10-CM | POA: Diagnosis not present

## 2016-01-30 DIAGNOSIS — C494 Malignant neoplasm of connective and soft tissue of abdomen: Secondary | ICD-10-CM | POA: Diagnosis not present

## 2016-01-30 DIAGNOSIS — I10 Essential (primary) hypertension: Secondary | ICD-10-CM | POA: Diagnosis not present

## 2016-01-31 DIAGNOSIS — Z1212 Encounter for screening for malignant neoplasm of rectum: Secondary | ICD-10-CM | POA: Diagnosis not present

## 2016-02-11 ENCOUNTER — Ambulatory Visit (HOSPITAL_BASED_OUTPATIENT_CLINIC_OR_DEPARTMENT_OTHER): Payer: Medicare Other | Admitting: Oncology

## 2016-02-11 ENCOUNTER — Telehealth: Payer: Self-pay | Admitting: Oncology

## 2016-02-11 VITALS — BP 163/98 | HR 64 | Temp 97.9°F | Resp 18 | Ht 69.0 in | Wt 143.2 lb

## 2016-02-11 DIAGNOSIS — I10 Essential (primary) hypertension: Secondary | ICD-10-CM

## 2016-02-11 DIAGNOSIS — C49A2 Gastrointestinal stromal tumor of stomach: Secondary | ICD-10-CM | POA: Diagnosis not present

## 2016-02-11 NOTE — Telephone Encounter (Signed)
Appointments scheduled per 02/11/16 los.  Copy of  AVS report and appointment schedule was given to patient, per 02/11/16 los. ° °

## 2016-02-11 NOTE — Progress Notes (Signed)
  [place] OFFICE PROGRESS NOTE   Diagnosis: Gastrointestinal stromal tumor  INTERVAL HISTORY:   Hailey Daniels returns as scheduled. She feels well. No reflux symptoms. She reports Dr.Nandigam does not recommend a surveillance endoscopy. She is followed by Dr. Osborne Casco for internal medicine care and management of hypertension.  Objective:  Vital signs in last 24 hours:  Blood pressure (!) 163/98, pulse 64, temperature 97.9 F (36.6 C), temperature source Oral, resp. rate 18, height 5\' 9"  (1.753 m), weight 143 lb 3.2 oz (65 kg), SpO2 100 %.    HEENT: Neck without mass Lymphatics: No cervical, supraclavicular, axillary, or inguinal nodes Resp: Lungs clear bilaterally Cardio: Regular rate and rhythm GI: No hepatosplenomegaly, no mass, nontender Vascular: No leg edema  Medications: I have reviewed the patient's current medications.  Assessment/Plan: 1. Gastrointestinal stromal tumor diagnosed in March 2006. She remains in clinical remission. She completed adjuvant Gleevec therapy on the ACOSOG study. 2. History of Barretts esophagus. Last endoscopy 10/27/2013 with no evidence of Barretts esophagus or recurrent gastrointestinal stromal tumor. 3. Hypertension.     Disposition:  Hailey Daniels remains in clinical remission from the gastrointestinal stromal tumor. She would like to continue follow-up at the [facility]. She will return for an office visit in one year.  Hailey Coder, MD  02/11/2016  12:56 PM

## 2016-05-04 DIAGNOSIS — Z85828 Personal history of other malignant neoplasm of skin: Secondary | ICD-10-CM | POA: Diagnosis not present

## 2016-05-04 DIAGNOSIS — L720 Epidermal cyst: Secondary | ICD-10-CM | POA: Diagnosis not present

## 2016-05-04 DIAGNOSIS — L570 Actinic keratosis: Secondary | ICD-10-CM | POA: Diagnosis not present

## 2016-06-30 DIAGNOSIS — H401131 Primary open-angle glaucoma, bilateral, mild stage: Secondary | ICD-10-CM | POA: Diagnosis not present

## 2016-08-13 DIAGNOSIS — H1032 Unspecified acute conjunctivitis, left eye: Secondary | ICD-10-CM | POA: Diagnosis not present

## 2016-08-14 DIAGNOSIS — H1032 Unspecified acute conjunctivitis, left eye: Secondary | ICD-10-CM | POA: Diagnosis not present

## 2016-08-14 DIAGNOSIS — H401221 Low-tension glaucoma, left eye, mild stage: Secondary | ICD-10-CM | POA: Diagnosis not present

## 2016-08-14 DIAGNOSIS — H04322 Acute dacryocystitis of left lacrimal passage: Secondary | ICD-10-CM | POA: Diagnosis not present

## 2016-08-14 DIAGNOSIS — H5712 Ocular pain, left eye: Secondary | ICD-10-CM | POA: Diagnosis not present

## 2016-08-17 DIAGNOSIS — L03213 Periorbital cellulitis: Secondary | ICD-10-CM | POA: Diagnosis not present

## 2016-08-17 DIAGNOSIS — H1032 Unspecified acute conjunctivitis, left eye: Secondary | ICD-10-CM | POA: Diagnosis not present

## 2016-08-17 DIAGNOSIS — H04322 Acute dacryocystitis of left lacrimal passage: Secondary | ICD-10-CM | POA: Diagnosis not present

## 2016-08-17 DIAGNOSIS — H5712 Ocular pain, left eye: Secondary | ICD-10-CM | POA: Diagnosis not present

## 2016-08-18 DIAGNOSIS — H04222 Epiphora due to insufficient drainage, left lacrimal gland: Secondary | ICD-10-CM | POA: Diagnosis not present

## 2016-08-18 DIAGNOSIS — H04552 Acquired stenosis of left nasolacrimal duct: Secondary | ICD-10-CM | POA: Diagnosis not present

## 2016-08-18 DIAGNOSIS — H04322 Acute dacryocystitis of left lacrimal passage: Secondary | ICD-10-CM | POA: Diagnosis not present

## 2016-10-20 ENCOUNTER — Other Ambulatory Visit: Payer: Self-pay

## 2016-10-20 DIAGNOSIS — H04552 Acquired stenosis of left nasolacrimal duct: Secondary | ICD-10-CM | POA: Diagnosis not present

## 2016-10-20 DIAGNOSIS — H04322 Acute dacryocystitis of left lacrimal passage: Secondary | ICD-10-CM | POA: Diagnosis not present

## 2016-10-20 DIAGNOSIS — H04222 Epiphora due to insufficient drainage, left lacrimal gland: Secondary | ICD-10-CM | POA: Diagnosis not present

## 2016-10-20 DIAGNOSIS — H04412 Chronic dacryocystitis of left lacrimal passage: Secondary | ICD-10-CM | POA: Diagnosis not present

## 2016-11-16 ENCOUNTER — Telehealth: Payer: Self-pay

## 2016-11-16 NOTE — Telephone Encounter (Signed)
Left voice messages with time change for upcoming appointment on 11/12 @ 8:30 am. per los

## 2016-11-24 DIAGNOSIS — Z09 Encounter for follow-up examination after completed treatment for conditions other than malignant neoplasm: Secondary | ICD-10-CM | POA: Diagnosis not present

## 2016-11-24 DIAGNOSIS — H04322 Acute dacryocystitis of left lacrimal passage: Secondary | ICD-10-CM | POA: Diagnosis not present

## 2016-11-24 DIAGNOSIS — H04552 Acquired stenosis of left nasolacrimal duct: Secondary | ICD-10-CM | POA: Diagnosis not present

## 2016-11-27 DIAGNOSIS — Z1231 Encounter for screening mammogram for malignant neoplasm of breast: Secondary | ICD-10-CM | POA: Diagnosis not present

## 2016-12-08 DIAGNOSIS — H04222 Epiphora due to insufficient drainage, left lacrimal gland: Secondary | ICD-10-CM | POA: Diagnosis not present

## 2016-12-08 DIAGNOSIS — H04552 Acquired stenosis of left nasolacrimal duct: Secondary | ICD-10-CM | POA: Diagnosis not present

## 2016-12-08 DIAGNOSIS — Z09 Encounter for follow-up examination after completed treatment for conditions other than malignant neoplasm: Secondary | ICD-10-CM | POA: Diagnosis not present

## 2017-01-22 DIAGNOSIS — E7800 Pure hypercholesterolemia, unspecified: Secondary | ICD-10-CM | POA: Diagnosis not present

## 2017-01-22 DIAGNOSIS — I10 Essential (primary) hypertension: Secondary | ICD-10-CM | POA: Diagnosis not present

## 2017-01-22 DIAGNOSIS — R82998 Other abnormal findings in urine: Secondary | ICD-10-CM | POA: Diagnosis not present

## 2017-01-22 DIAGNOSIS — R7301 Impaired fasting glucose: Secondary | ICD-10-CM | POA: Diagnosis not present

## 2017-02-01 DIAGNOSIS — Z23 Encounter for immunization: Secondary | ICD-10-CM | POA: Diagnosis not present

## 2017-02-01 DIAGNOSIS — Z79890 Hormone replacement therapy (postmenopausal): Secondary | ICD-10-CM | POA: Diagnosis not present

## 2017-02-01 DIAGNOSIS — R7301 Impaired fasting glucose: Secondary | ICD-10-CM | POA: Diagnosis not present

## 2017-02-01 DIAGNOSIS — K208 Other esophagitis: Secondary | ICD-10-CM | POA: Diagnosis not present

## 2017-02-01 DIAGNOSIS — K219 Gastro-esophageal reflux disease without esophagitis: Secondary | ICD-10-CM | POA: Diagnosis not present

## 2017-02-01 DIAGNOSIS — C494 Malignant neoplasm of connective and soft tissue of abdomen: Secondary | ICD-10-CM | POA: Diagnosis not present

## 2017-02-01 DIAGNOSIS — Z0000 Encounter for general adult medical examination without abnormal findings: Secondary | ICD-10-CM | POA: Diagnosis not present

## 2017-02-01 DIAGNOSIS — I10 Essential (primary) hypertension: Secondary | ICD-10-CM | POA: Diagnosis not present

## 2017-02-01 DIAGNOSIS — E7800 Pure hypercholesterolemia, unspecified: Secondary | ICD-10-CM | POA: Diagnosis not present

## 2017-02-01 DIAGNOSIS — Z1389 Encounter for screening for other disorder: Secondary | ICD-10-CM | POA: Diagnosis not present

## 2017-02-01 DIAGNOSIS — I447 Left bundle-branch block, unspecified: Secondary | ICD-10-CM | POA: Diagnosis not present

## 2017-02-01 DIAGNOSIS — Z6821 Body mass index (BMI) 21.0-21.9, adult: Secondary | ICD-10-CM | POA: Diagnosis not present

## 2017-02-02 DIAGNOSIS — Z124 Encounter for screening for malignant neoplasm of cervix: Secondary | ICD-10-CM | POA: Diagnosis not present

## 2017-02-02 DIAGNOSIS — Z01419 Encounter for gynecological examination (general) (routine) without abnormal findings: Secondary | ICD-10-CM | POA: Diagnosis not present

## 2017-02-03 DIAGNOSIS — Z1212 Encounter for screening for malignant neoplasm of rectum: Secondary | ICD-10-CM | POA: Diagnosis not present

## 2017-02-08 ENCOUNTER — Telehealth: Payer: Self-pay | Admitting: Oncology

## 2017-02-08 ENCOUNTER — Encounter: Payer: Self-pay | Admitting: Oncology

## 2017-02-08 ENCOUNTER — Ambulatory Visit (HOSPITAL_BASED_OUTPATIENT_CLINIC_OR_DEPARTMENT_OTHER): Payer: Medicare Other | Admitting: Oncology

## 2017-02-08 VITALS — BP 154/80 | HR 58 | Temp 97.6°F | Resp 18 | Ht 69.0 in | Wt 146.7 lb

## 2017-02-08 DIAGNOSIS — Z85028 Personal history of other malignant neoplasm of stomach: Secondary | ICD-10-CM | POA: Diagnosis not present

## 2017-02-08 DIAGNOSIS — Z9221 Personal history of antineoplastic chemotherapy: Secondary | ICD-10-CM

## 2017-02-08 DIAGNOSIS — I10 Essential (primary) hypertension: Secondary | ICD-10-CM | POA: Diagnosis not present

## 2017-02-08 DIAGNOSIS — C49A2 Gastrointestinal stromal tumor of stomach: Secondary | ICD-10-CM

## 2017-02-08 NOTE — Telephone Encounter (Signed)
Gave avs and calendar for November 2019 °

## 2017-02-08 NOTE — Progress Notes (Signed)
  [place] OFFICE PROGRESS NOTE   Diagnosis: Gastrointestinal stromal tumor  INTERVAL HISTORY:   Hailey Daniels returns as scheduled.  She feels well.  No dysphasia.  No complaint.  She had tear duct surgery earlier this year.  Objective:  Vital signs in last 24 hours:  Blood pressure (!) 154/80, pulse (!) 58, temperature 97.6 F (36.4 C), temperature source Oral, resp. rate 18, height 5\' 9"  (1.753 m), weight 146 lb 11.2 oz (66.5 kg), SpO2 99 %.    HEENT: Neck without mass Lymphatics: No cervical, supraclavicular, axillary, or inguinal nodes Resp: Lungs clear bilaterally Cardio: Regular rate and rhythm GI: No hepatosplenomegaly, no mass, nontender Vascular: No leg edema    Medications: I have reviewed the patient's current medications.  Assessment/Plan: 1. 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 She remains in clinical remission. She completed adjuvant Gleevec therapy on the ACOSOG study. 2. History of Barretts esophagus. Last endoscopy 10/27/2013 with no evidence of Barretts esophagus or recurrent gastrointestinal stromal tumor. 3. Hypertension.   Disposition:  Hailey Daniels is in clinical remission from the gastrointestinal stromal tumor.  She would like to continue follow-up at the Cancer center.  She will return for an office visit in 1 year. 15 minutes were spent with the patient today.  The majority of the time was used for counseling and coordination of care.  Hailey Coder, MD  02/08/2017  8:59 AM

## 2017-02-12 ENCOUNTER — Encounter: Payer: Self-pay | Admitting: Gastroenterology

## 2017-02-22 DIAGNOSIS — L821 Other seborrheic keratosis: Secondary | ICD-10-CM | POA: Diagnosis not present

## 2017-02-22 DIAGNOSIS — D1801 Hemangioma of skin and subcutaneous tissue: Secondary | ICD-10-CM | POA: Diagnosis not present

## 2017-02-22 DIAGNOSIS — Z85828 Personal history of other malignant neoplasm of skin: Secondary | ICD-10-CM | POA: Diagnosis not present

## 2017-02-22 DIAGNOSIS — L603 Nail dystrophy: Secondary | ICD-10-CM | POA: Diagnosis not present

## 2017-02-22 DIAGNOSIS — L570 Actinic keratosis: Secondary | ICD-10-CM | POA: Diagnosis not present

## 2017-02-24 ENCOUNTER — Other Ambulatory Visit: Payer: Self-pay | Admitting: Gastroenterology

## 2017-04-12 DIAGNOSIS — H5213 Myopia, bilateral: Secondary | ICD-10-CM | POA: Diagnosis not present

## 2017-04-12 DIAGNOSIS — H401131 Primary open-angle glaucoma, bilateral, mild stage: Secondary | ICD-10-CM | POA: Diagnosis not present

## 2017-04-12 DIAGNOSIS — H2513 Age-related nuclear cataract, bilateral: Secondary | ICD-10-CM | POA: Diagnosis not present

## 2017-04-26 DIAGNOSIS — L570 Actinic keratosis: Secondary | ICD-10-CM | POA: Diagnosis not present

## 2017-04-26 DIAGNOSIS — Z85828 Personal history of other malignant neoplasm of skin: Secondary | ICD-10-CM | POA: Diagnosis not present

## 2017-04-29 ENCOUNTER — Encounter: Payer: Self-pay | Admitting: Gastroenterology

## 2017-06-28 ENCOUNTER — Ambulatory Visit (AMBULATORY_SURGERY_CENTER): Payer: Self-pay

## 2017-06-28 VITALS — Ht 69.0 in | Wt 146.0 lb

## 2017-06-28 DIAGNOSIS — Z1211 Encounter for screening for malignant neoplasm of colon: Secondary | ICD-10-CM

## 2017-06-28 NOTE — Progress Notes (Signed)
Per pt, no allergies to soy or egg products.Pt not taking any weight loss meds or using  O2 at home.  Pt refused emmi video. 

## 2017-07-07 ENCOUNTER — Encounter: Payer: Self-pay | Admitting: Gastroenterology

## 2017-07-07 ENCOUNTER — Other Ambulatory Visit: Payer: Self-pay

## 2017-07-07 ENCOUNTER — Ambulatory Visit (AMBULATORY_SURGERY_CENTER): Payer: Medicare Other | Admitting: Gastroenterology

## 2017-07-07 VITALS — BP 131/71 | HR 56 | Temp 96.2°F | Resp 14 | Ht 69.0 in | Wt 146.0 lb

## 2017-07-07 DIAGNOSIS — K449 Diaphragmatic hernia without obstruction or gangrene: Secondary | ICD-10-CM | POA: Diagnosis not present

## 2017-07-07 DIAGNOSIS — I10 Essential (primary) hypertension: Secondary | ICD-10-CM | POA: Diagnosis not present

## 2017-07-07 DIAGNOSIS — D123 Benign neoplasm of transverse colon: Secondary | ICD-10-CM

## 2017-07-07 DIAGNOSIS — K2270 Barrett's esophagus without dysplasia: Secondary | ICD-10-CM | POA: Diagnosis not present

## 2017-07-07 DIAGNOSIS — Z1212 Encounter for screening for malignant neoplasm of rectum: Secondary | ICD-10-CM

## 2017-07-07 DIAGNOSIS — Z1211 Encounter for screening for malignant neoplasm of colon: Secondary | ICD-10-CM

## 2017-07-07 DIAGNOSIS — K219 Gastro-esophageal reflux disease without esophagitis: Secondary | ICD-10-CM | POA: Diagnosis not present

## 2017-07-07 HISTORY — PX: COLONOSCOPY: SHX174

## 2017-07-07 MED ORDER — SODIUM CHLORIDE 0.9 % IV SOLN: 500.0000 mL | Freq: Once | INTRAVENOUS | Status: DC

## 2017-07-07 NOTE — Progress Notes (Signed)
Called to room to assist during endoscopic procedure.  Patient ID and intended procedure confirmed with present staff. Received instructions for my participation in the procedure from the performing physician.  

## 2017-07-07 NOTE — Progress Notes (Signed)
Report to PACU, RN, vss, BBS= Clear.  

## 2017-07-07 NOTE — Progress Notes (Signed)
Pt's states no medical or surgical changes since previsit or office visit. 

## 2017-07-07 NOTE — Progress Notes (Signed)
Per Dr. Silverio Decamp pt may continue taking asa 81 mg as she is already taking but no other NSAIDs for 2 weeks. maw

## 2017-07-07 NOTE — Progress Notes (Signed)
No problems noted in the recovery room. maw 

## 2017-07-07 NOTE — Op Note (Signed)
[place] Patient Name: Hailey Daniels Procedure Date: 07/07/2017 8:40 AM MRN: [id number] Endoscopist: Mauri Pole , MD Age: 72 Referring MD:  Date of Birth: [birth year] Gender: Female Account #: [id number] Procedure:                Colonoscopy Indications:              Screening for colorectal malignant neoplasm, Last                            colonoscopy: 2008 Medicines:                Monitored Anesthesia Care Procedure:                Pre-Anesthesia Assessment:                           - Prior to the procedure, a History and Physical                            was performed, and patient medications and                            allergies were reviewed. The patient's tolerance of                            previous anesthesia was also reviewed. The risks                            and benefits of the procedure and the sedation                            options and risks were discussed with the patient.                            All questions were answered, and informed consent                            was obtained. Prior Anticoagulants: The patient has                            taken no previous anticoagulant or antiplatelet                            agents. ASA Grade Assessment: II - A patient with                            mild systemic disease. After reviewing the risks                            and benefits, the patient was deemed in                            satisfactory condition to undergo the procedure.  After obtaining informed consent, the colonoscope                            was passed under direct vision. Throughout the                            procedure, the patient's blood pressure, pulse, and                            oxygen saturations were monitored continuously. The                            Colonoscope was introduced through the anus and                            advanced to the the cecum, identified  by                            appendiceal orifice and ileocecal valve. The                            colonoscopy was performed without difficulty. The                            patient tolerated the procedure well. The quality                            of the bowel preparation was excellent. The                            ileocecal valve, appendiceal orifice, and rectum                            were photographed. Scope In: 8:48:54 AM Scope Out: 9:04:08 AM Scope Withdrawal Time: 0 hours 9 minutes 17 seconds  Total Procedure Duration: 0 hours 15 minutes 14 seconds  Findings:                 The perianal and digital rectal examinations were                            normal.                           A patchy area of mildly friable mucosa with contact                            bleeding was found in the ascending colon and in                            the cecum suggestive of possible barotrauma even                            though insertion time was very short and without  difficulty vs prep related.                           A 14 mm polyp was found in the transverse colon.                            The polyp was semi-pedunculated. The polyp was                            removed with a hot snare. Resection and retrieval                            were complete.                           Multiple small and large-mouthed diverticula were                            found in the sigmoid colon and descending colon.                            There was narrowing of the colon in association                            with the diverticular opening. There was evidence                            of diverticular spasm. Peri-diverticular erythema                            was seen.                           Non-bleeding internal hemorrhoids were found during                            retroflexion. The hemorrhoids were small.                           The exam was  otherwise without abnormality. Complications:            No immediate complications. Estimated Blood Loss:     Estimated blood loss was minimal. Impression:               - One 14 mm polyp in the transverse colon, removed                            with a hot snare. Resected and retrieved.                           - Severe diverticulosis in the sigmoid colon and in                            the descending colon. There was narrowing of the  colon in association with the diverticular opening.                            There was evidence of diverticular spasm.                            Peri-diverticular erythema was seen.                           - Friability with contact bleeding in the ascending                            colon and in the cecum.                           - Non-bleeding internal hemorrhoids.                           - The examination was otherwise normal. Recommendation:           - Patient has a contact number available for                            emergencies. The signs and symptoms of potential                            delayed complications were discussed with the                            patient. Return to normal activities tomorrow.                            Written discharge instructions were provided to the                            patient.                           - Resume previous diet.                           - Continue present medications.                           - No ibuprofen, naproxen, or other non-steroidal                            anti-inflammatory drugs for 2 weeks.                           - Await pathology results.                           - Repeat colonoscopy in 3 years for surveillance                            based on pathology results.  Mauri Pole, MD 07/07/2017 9:11:04 AM This report has been signed electronically.

## 2017-07-07 NOTE — Patient Instructions (Signed)
YOU HAD AN ENDOSCOPIC PROCEDURE TODAY AT [facility]:   Refer to the procedure report that was given to you for any specific questions about what was found during the examination.  If the procedure report does not answer your questions, please call your gastroenterologist to clarify.  If you requested that your care partner not be given the details of your procedure findings, then the procedure report has been included in a sealed envelope for you to review at your convenience later.  YOU SHOULD EXPECT: Some feelings of bloating in the abdomen. Passage of more gas than usual.  Walking can help get rid of the air that was put into your GI tract during the procedure and reduce the bloating. If you had a lower endoscopy (such as a colonoscopy or flexible sigmoidoscopy) you may notice spotting of blood in your stool or on the toilet paper. If you underwent a bowel prep for your procedure, you may not have a normal bowel movement for a few days.  Please Note:  You might notice some irritation and congestion in your nose or some drainage.  This is from the oxygen used during your procedure.  There is no need for concern and it should clear up in a day or so.  SYMPTOMS TO REPORT IMMEDIATELY:   Following lower endoscopy (colonoscopy or flexible sigmoidoscopy):  Excessive amounts of blood in the stool  Significant tenderness or worsening of abdominal pains  Swelling of the abdomen that is new, acute  Fever of 100F or higher   For urgent or emergent issues, a gastroenterologist can be reached at any hour by calling [phone].   DIET:  We do recommend a small meal at first, but then you may proceed to your regular diet.  Drink plenty of fluids but you should avoid alcoholic beverages for 24 hours.  ACTIVITY:  You should plan to take it easy for the rest of today and you should NOT DRIVE or use heavy machinery until tomorrow (because of the sedation medicines used during the test).     FOLLOW UP: Our staff will call the number listed on your records the next business day following your procedure to check on you and address any questions or concerns that you may have regarding the information given to you following your procedure. If we do not reach you, we will leave a message.  However, if you are feeling well and you are not experiencing any problems, there is no need to return our call.  We will assume that you have returned to your regular daily activities without incident.  If any biopsies were taken you will be contacted by phone or by letter within the next 1-3 weeks.  Please call us at [phone] if you have not heard about the biopsies in 3 weeks.    SIGNATURES/CONFIDENTIALITY: You and/or your care partner have signed paperwork which will be entered into your electronic medical record.  These signatures attest to the fact that that the information above on your After Visit Summary has been reviewed and is understood.  Full responsibility of the confidentiality of this discharge information lies with you and/or your care-partner.   Handouts were given to your care partner on polyps, diverticulosis, and hemorrhoids. NO ASPIRIN, ASPIRIN CONTAINING PRODUCTS (BC OR GOODY POWDERS) OR NSAIDS (IBUPROFEN, ADVIL, ALEVE, AND MOTRIN) FOR 2 weeks; TYLENOL IS OK TO TAKE.  Per Dr. Silverio Decamp pt may take aspirin 81 mg. You may resume your current medications today. Await  biopsy results. Please call if any questions or concerns.

## 2017-07-08 ENCOUNTER — Telehealth: Payer: Self-pay

## 2017-07-08 ENCOUNTER — Telehealth: Payer: Self-pay | Admitting: *Deleted

## 2017-07-08 NOTE — Telephone Encounter (Signed)
Attempted to reach patient for post-procedure f/u call (this is the 2nd call). No answer. Left message for patient to please not hesitate to call us if she has any questions/concerns regarding her care.

## 2017-07-08 NOTE — Telephone Encounter (Signed)
  Follow up Call-  Call back number 07/07/2017  Post procedure Call Back phone  # [phone]  Permission to leave phone message Yes  Some recent data might be hidden    [facility]

## 2017-07-14 ENCOUNTER — Encounter: Payer: Self-pay | Admitting: Gastroenterology

## 2017-09-14 DIAGNOSIS — L821 Other seborrheic keratosis: Secondary | ICD-10-CM | POA: Diagnosis not present

## 2017-09-14 DIAGNOSIS — L570 Actinic keratosis: Secondary | ICD-10-CM | POA: Diagnosis not present

## 2017-09-14 DIAGNOSIS — Z85828 Personal history of other malignant neoplasm of skin: Secondary | ICD-10-CM | POA: Diagnosis not present

## 2017-10-19 DIAGNOSIS — H401131 Primary open-angle glaucoma, bilateral, mild stage: Secondary | ICD-10-CM | POA: Diagnosis not present

## 2017-11-01 DIAGNOSIS — Z85828 Personal history of other malignant neoplasm of skin: Secondary | ICD-10-CM | POA: Diagnosis not present

## 2017-11-01 DIAGNOSIS — L570 Actinic keratosis: Secondary | ICD-10-CM | POA: Diagnosis not present

## 2017-11-01 DIAGNOSIS — D485 Neoplasm of uncertain behavior of skin: Secondary | ICD-10-CM | POA: Diagnosis not present

## 2017-11-01 DIAGNOSIS — L700 Acne vulgaris: Secondary | ICD-10-CM | POA: Diagnosis not present

## 2017-11-01 DIAGNOSIS — C44319 Basal cell carcinoma of skin of other parts of face: Secondary | ICD-10-CM | POA: Diagnosis not present

## 2017-12-09 DIAGNOSIS — Z1231 Encounter for screening mammogram for malignant neoplasm of breast: Secondary | ICD-10-CM | POA: Diagnosis not present

## 2017-12-14 DIAGNOSIS — C44319 Basal cell carcinoma of skin of other parts of face: Secondary | ICD-10-CM | POA: Diagnosis not present

## 2017-12-14 DIAGNOSIS — Z85828 Personal history of other malignant neoplasm of skin: Secondary | ICD-10-CM | POA: Diagnosis not present

## 2018-02-01 DIAGNOSIS — L820 Inflamed seborrheic keratosis: Secondary | ICD-10-CM | POA: Diagnosis not present

## 2018-02-01 DIAGNOSIS — Z85828 Personal history of other malignant neoplasm of skin: Secondary | ICD-10-CM | POA: Diagnosis not present

## 2018-02-01 DIAGNOSIS — I788 Other diseases of capillaries: Secondary | ICD-10-CM | POA: Diagnosis not present

## 2018-02-01 DIAGNOSIS — L570 Actinic keratosis: Secondary | ICD-10-CM | POA: Diagnosis not present

## 2018-02-01 DIAGNOSIS — L821 Other seborrheic keratosis: Secondary | ICD-10-CM | POA: Diagnosis not present

## 2018-02-08 ENCOUNTER — Inpatient Hospital Stay: Payer: Medicare Other | Attending: Oncology | Admitting: Oncology

## 2018-02-08 ENCOUNTER — Telehealth: Payer: Self-pay

## 2018-02-08 VITALS — BP 147/76 | HR 65 | Temp 97.6°F | Resp 18 | Ht 69.0 in | Wt 146.2 lb

## 2018-02-08 DIAGNOSIS — Z9221 Personal history of antineoplastic chemotherapy: Secondary | ICD-10-CM

## 2018-02-08 DIAGNOSIS — Z85028 Personal history of other malignant neoplasm of stomach: Secondary | ICD-10-CM | POA: Diagnosis not present

## 2018-02-08 DIAGNOSIS — I10 Essential (primary) hypertension: Secondary | ICD-10-CM | POA: Diagnosis not present

## 2018-02-08 DIAGNOSIS — C49A2 Gastrointestinal stromal tumor of stomach: Secondary | ICD-10-CM

## 2018-02-08 NOTE — Progress Notes (Signed)
  [facility] OFFICE PROGRESS NOTE   Diagnosis: Gastrointestinal stromal tumor  INTERVAL HISTORY:  Ms. Sandles returns as scheduled.  She feels well.  No dysphasia.  She has discomfort at the right first MCP joint.  No other complaint. She underwent a colonoscopy 07/07/2017.  A polyp was removed from the transverse colon.  He returned as a tubular adenoma.  Objective:  Vital signs in last 24 hours:  Blood pressure (!) 147/76, pulse 65, temperature 97.6 F (36.4 C), temperature source Oral, resp. rate 18, height 5\' 9"  (1.753 m), weight 146 lb 3.2 oz (66.3 kg), SpO2 100 %.    HEENT: Neck without mass Lymphatics: No cervical, supraclavicular, axillary, or inguinal nodes Resp: Lungs clear bilaterally Cardio: Regular rate and rhythm GI: No hepatosplenomegaly, no mass, nontender Vascular: No leg edema Musculoskeletal: Examination of the right first MTP joint is unremarkable Skin: Multiple benign-appearing moles over the trunk   Medications: I have reviewed the patient's current medications.   Assessment/Plan: 1. 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 She remains in clinical remission. She completed adjuvant Gleevec therapy on the ACOSOG study. 2. History of Barretts esophagus. Last endoscopy 10/27/2013 with no evidence of Barretts esophagus or recurrent gastrointestinal stromal tumor. 3. Hypertension.    Disposition: Ms. Kabel remains in clinical remission from the gastrointestinal stromal tumor.  She would like to continue follow-up at the Cancer center.  She will return for an office visit in 1 year.  She will continue endoscopic surveillance with Dr. Silverio Decamp  15 minutes were spent with the patient today.  The majority of the time was used for counseling and coordination of care.  Betsy Coder, MD  02/08/2018  9:22 AM

## 2018-02-08 NOTE — Telephone Encounter (Signed)
Printed avs and calender of upcoming appointment. Per 11/12 los. 

## 2018-03-01 DIAGNOSIS — E7800 Pure hypercholesterolemia, unspecified: Secondary | ICD-10-CM | POA: Diagnosis not present

## 2018-03-01 DIAGNOSIS — R82998 Other abnormal findings in urine: Secondary | ICD-10-CM | POA: Diagnosis not present

## 2018-03-01 DIAGNOSIS — I10 Essential (primary) hypertension: Secondary | ICD-10-CM | POA: Diagnosis not present

## 2018-03-01 DIAGNOSIS — R7301 Impaired fasting glucose: Secondary | ICD-10-CM | POA: Diagnosis not present

## 2018-03-01 DIAGNOSIS — Z23 Encounter for immunization: Secondary | ICD-10-CM | POA: Diagnosis not present

## 2018-03-03 DIAGNOSIS — Z6821 Body mass index (BMI) 21.0-21.9, adult: Secondary | ICD-10-CM | POA: Diagnosis not present

## 2018-03-03 DIAGNOSIS — R7301 Impaired fasting glucose: Secondary | ICD-10-CM | POA: Diagnosis not present

## 2018-03-03 DIAGNOSIS — E7800 Pure hypercholesterolemia, unspecified: Secondary | ICD-10-CM | POA: Diagnosis not present

## 2018-03-03 DIAGNOSIS — H6122 Impacted cerumen, left ear: Secondary | ICD-10-CM | POA: Diagnosis not present

## 2018-03-03 DIAGNOSIS — K208 Other esophagitis: Secondary | ICD-10-CM | POA: Diagnosis not present

## 2018-03-03 DIAGNOSIS — D126 Benign neoplasm of colon, unspecified: Secondary | ICD-10-CM | POA: Diagnosis not present

## 2018-03-03 DIAGNOSIS — Z1389 Encounter for screening for other disorder: Secondary | ICD-10-CM | POA: Diagnosis not present

## 2018-03-03 DIAGNOSIS — Z1212 Encounter for screening for malignant neoplasm of rectum: Secondary | ICD-10-CM | POA: Diagnosis not present

## 2018-03-03 DIAGNOSIS — Z0000 Encounter for general adult medical examination without abnormal findings: Secondary | ICD-10-CM | POA: Diagnosis not present

## 2018-03-03 DIAGNOSIS — C494 Malignant neoplasm of connective and soft tissue of abdomen: Secondary | ICD-10-CM | POA: Diagnosis not present

## 2018-03-03 DIAGNOSIS — I10 Essential (primary) hypertension: Secondary | ICD-10-CM | POA: Diagnosis not present

## 2018-03-03 DIAGNOSIS — K219 Gastro-esophageal reflux disease without esophagitis: Secondary | ICD-10-CM | POA: Diagnosis not present

## 2018-03-03 DIAGNOSIS — I447 Left bundle-branch block, unspecified: Secondary | ICD-10-CM | POA: Diagnosis not present

## 2018-03-15 DIAGNOSIS — Z85828 Personal history of other malignant neoplasm of skin: Secondary | ICD-10-CM | POA: Diagnosis not present

## 2018-03-15 DIAGNOSIS — L300 Nummular dermatitis: Secondary | ICD-10-CM | POA: Diagnosis not present

## 2018-03-15 DIAGNOSIS — L821 Other seborrheic keratosis: Secondary | ICD-10-CM | POA: Diagnosis not present

## 2018-03-15 DIAGNOSIS — L570 Actinic keratosis: Secondary | ICD-10-CM | POA: Diagnosis not present

## 2018-03-17 ENCOUNTER — Telehealth: Payer: Self-pay | Admitting: Gastroenterology

## 2018-03-17 ENCOUNTER — Other Ambulatory Visit: Payer: Self-pay | Admitting: Gastroenterology

## 2018-03-17 MED ORDER — FAMOTIDINE 40 MG PO TABS: ORAL_TABLET | ORAL | 3 refills | Status: DC

## 2018-03-17 NOTE — Telephone Encounter (Signed)
Pt needs refill for Pepcid at [facility]. Last seen 07/07/17

## 2018-07-18 DIAGNOSIS — Z85828 Personal history of other malignant neoplasm of skin: Secondary | ICD-10-CM | POA: Diagnosis not present

## 2018-07-18 DIAGNOSIS — D485 Neoplasm of uncertain behavior of skin: Secondary | ICD-10-CM | POA: Diagnosis not present

## 2018-07-18 DIAGNOSIS — C44729 Squamous cell carcinoma of skin of left lower limb, including hip: Secondary | ICD-10-CM | POA: Diagnosis not present

## 2018-07-18 DIAGNOSIS — L570 Actinic keratosis: Secondary | ICD-10-CM | POA: Diagnosis not present

## 2018-09-09 DIAGNOSIS — H2513 Age-related nuclear cataract, bilateral: Secondary | ICD-10-CM | POA: Diagnosis not present

## 2018-09-09 DIAGNOSIS — H401131 Primary open-angle glaucoma, bilateral, mild stage: Secondary | ICD-10-CM | POA: Diagnosis not present

## 2018-09-09 DIAGNOSIS — H5213 Myopia, bilateral: Secondary | ICD-10-CM | POA: Diagnosis not present

## 2018-09-15 ENCOUNTER — Encounter: Payer: Self-pay | Admitting: Gastroenterology

## 2018-10-24 DIAGNOSIS — L570 Actinic keratosis: Secondary | ICD-10-CM | POA: Diagnosis not present

## 2018-10-24 DIAGNOSIS — Z85828 Personal history of other malignant neoplasm of skin: Secondary | ICD-10-CM | POA: Diagnosis not present

## 2018-10-24 DIAGNOSIS — L821 Other seborrheic keratosis: Secondary | ICD-10-CM | POA: Diagnosis not present

## 2018-10-27 ENCOUNTER — Other Ambulatory Visit: Payer: Self-pay

## 2018-10-27 ENCOUNTER — Ambulatory Visit: Payer: Medicare Other | Admitting: *Deleted

## 2018-10-27 VITALS — Ht 69.0 in | Wt 140.0 lb

## 2018-10-27 DIAGNOSIS — K2270 Barrett's esophagus without dysplasia: Secondary | ICD-10-CM

## 2018-10-27 NOTE — Progress Notes (Signed)
Precert via telephone. ID by name,dob and address   No egg or soy allergy known to patient  No issues with past sedation with any surgeries  or procedures, no intubation problems  No diet pills per patient No home 02 use per patient  No blood thinners per patient   No A fib or A flutter  EMMI information, Instructions, consent and acknowledgement form with envelope to return ASAP. Patient aware.

## 2018-11-04 DIAGNOSIS — H900 Conductive hearing loss, bilateral: Secondary | ICD-10-CM | POA: Diagnosis not present

## 2018-11-04 DIAGNOSIS — H6123 Impacted cerumen, bilateral: Secondary | ICD-10-CM | POA: Diagnosis not present

## 2018-11-09 ENCOUNTER — Telehealth: Payer: Self-pay | Admitting: Gastroenterology

## 2018-11-09 NOTE — Telephone Encounter (Signed)

## 2018-11-10 ENCOUNTER — Ambulatory Visit (AMBULATORY_SURGERY_CENTER): Payer: Medicare Other | Admitting: Gastroenterology

## 2018-11-10 ENCOUNTER — Encounter: Payer: Self-pay | Admitting: Gastroenterology

## 2018-11-10 ENCOUNTER — Other Ambulatory Visit: Payer: Self-pay

## 2018-11-10 VITALS — BP 133/71 | HR 68 | Temp 97.7°F | Resp 17 | Ht 69.0 in | Wt 140.0 lb

## 2018-11-10 DIAGNOSIS — K317 Polyp of stomach and duodenum: Secondary | ICD-10-CM | POA: Diagnosis not present

## 2018-11-10 DIAGNOSIS — K210 Gastro-esophageal reflux disease with esophagitis: Secondary | ICD-10-CM

## 2018-11-10 DIAGNOSIS — K2270 Barrett's esophagus without dysplasia: Secondary | ICD-10-CM

## 2018-11-10 DIAGNOSIS — K219 Gastro-esophageal reflux disease without esophagitis: Secondary | ICD-10-CM | POA: Diagnosis not present

## 2018-11-10 DIAGNOSIS — K449 Diaphragmatic hernia without obstruction or gangrene: Secondary | ICD-10-CM | POA: Diagnosis not present

## 2018-11-10 MED ORDER — SODIUM CHLORIDE 0.9 % IV SOLN: 500.0000 mL | Freq: Once | INTRAVENOUS | Status: DC

## 2018-11-10 NOTE — Progress Notes (Signed)
Pt's states no medical or surgical changes since previsit or office visit.  Temp and VS taken by CW

## 2018-11-10 NOTE — Progress Notes (Signed)
To PACU, VSS. Report to RN.tb 

## 2018-11-10 NOTE — Patient Instructions (Signed)
Handouts given for esophagitis and Hiatal Hernia  YOU HAD AN ENDOSCOPIC PROCEDURE TODAY AT [facility]:   Refer to the procedure report that was given to you for any specific questions about what was found during the examination.  If the procedure report does not answer your questions, please call your gastroenterologist to clarify.  If you requested that your care partner not be given the details of your procedure findings, then the procedure report has been included in a sealed envelope for you to review at your convenience later.  YOU SHOULD EXPECT: Some feelings of bloating in the abdomen. Passage of more gas than usual.  Walking can help get rid of the air that was put into your GI tract during the procedure and reduce the bloating. If you had a lower endoscopy (such as a colonoscopy or flexible sigmoidoscopy) you may notice spotting of blood in your stool or on the toilet paper. If you underwent a bowel prep for your procedure, you may not have a normal bowel movement for a few days.  Please Note:  You might notice some irritation and congestion in your nose or some drainage.  This is from the oxygen used during your procedure.  There is no need for concern and it should clear up in a day or so.  SYMPTOMS TO REPORT IMMEDIATELY:   Following upper endoscopy (EGD)  Vomiting of blood or coffee ground material  New chest pain or pain under the shoulder blades  Painful or persistently difficult swallowing  New shortness of breath  Fever of 100F or higher  Black, tarry-looking stools  For urgent or emergent issues, a gastroenterologist can be reached at any hour by calling [phone].   DIET:  We do recommend a small meal at first, but then you may proceed to your regular diet.  Drink plenty of fluids but you should avoid alcoholic beverages for 24 hours.  ACTIVITY:  You should plan to take it easy for the rest of today and you should NOT DRIVE or use heavy machinery  until tomorrow (because of the sedation medicines used during the test).    FOLLOW UP: Our staff will call the number listed on your records 48-72 hours following your procedure to check on you and address any questions or concerns that you may have regarding the information given to you following your procedure. If we do not reach you, we will leave a message.  We will attempt to reach you two times.  During this call, we will ask if you have developed any symptoms of COVID 19. If you develop any symptoms (ie: fever, flu-like symptoms, shortness of breath, cough etc.) before then, please call [phone].  If you test positive for Covid 19 in the 2 weeks post procedure, please call and report this information to [place].    If any biopsies were taken you will be contacted by phone or by letter within the next 1-3 weeks.  Please call us at [phone] if you have not heard about the biopsies in 3 weeks.    SIGNATURES/CONFIDENTIALITY: You and/or your care partner have signed paperwork which will be entered into your electronic medical record.  These signatures attest to the fact that that the information above on your After Visit Summary has been reviewed and is understood.  Full responsibility of the confidentiality of this discharge information lies with you and/or your care-partner.

## 2018-11-10 NOTE — Op Note (Signed)
[facility] Patient Name: Hailey Daniels Procedure Date: 11/10/2018 9:31 AM MRN: [id number] Endoscopist: Mauri Pole , MD Age: 73 Referring MD:  Date of Birth: 19-May-1945 Gender: Female Account #: [id number] Procedure:                Upper GI endoscopy Indications:              Surveillance for malignancy due to personal history                            of Barrett's esophagus. History gastric stromal                            tumor s/p resection Medicines:                Monitored Anesthesia Care Procedure:                Pre-Anesthesia Assessment:                           - Prior to the procedure, a History and Physical                            was performed, and patient medications and                            allergies were reviewed. The patient's tolerance of                            previous anesthesia was also reviewed. The risks                            and benefits of the procedure and the sedation                            options and risks were discussed with the patient.                            All questions were answered, and informed consent                            was obtained. Prior Anticoagulants: The patient has                            taken no previous anticoagulant or antiplatelet                            agents. ASA Grade Assessment: II - A patient with                            mild systemic disease. After reviewing the risks                            and benefits, the patient was deemed in  satisfactory condition to undergo the procedure.                           After obtaining informed consent, the endoscope was                            passed under direct vision. Throughout the                            procedure, the patient's blood pressure, pulse, and                            oxygen saturations were monitored continuously. The                            Endoscope was introduced  through the mouth, and                            advanced to the second part of duodenum. The upper                            GI endoscopy was accomplished without difficulty.                            The patient tolerated the procedure well. Scope In: Scope Out: Findings:                 LA Grade B (one or more mucosal breaks greater than                            5 mm, not extending between the tops of two mucosal                            folds) esophagitis was found 34 to 35 cm from the                            incisors. Regular Z-line with no evidence of                            Barrett's esophagus.                           A medium-sized hiatal hernia was present.                           A deformity was found on the anterior wall of the                            stomach.                           Three 1 to 3 mm sessile polyps were found in the  cardia. The polyp was removed with a cold biopsy                            forceps. Resection and retrieval were complete.                           The examined duodenum was normal.                           The exam was otherwise without abnormality. Complications:            No immediate complications. Estimated Blood Loss:     Estimated blood loss was minimal. Impression:               - LA Grade B reflux esophagitis.                           - Medium-sized hiatal hernia.                           - Post-surgical deformity in the anterior wall of                            the stomach.                           - Three gastric polyps. Resected and retrieved.                           - Normal examined duodenum.                           - The examination was otherwise normal. Recommendation:           - Patient has a contact number available for                            emergencies. The signs and symptoms of potential                            delayed complications were discussed with the                             patient. Return to normal activities tomorrow.                            Written discharge instructions were provided to the                            patient.                           - Resume previous diet.                           - Continue present medications.                           -  Await pathology results.                           - Follow an antireflux regimen. Mauri Pole, MD 11/10/2018 9:52:50 AM This report has been signed electronically.

## 2018-11-11 ENCOUNTER — Encounter: Payer: Self-pay | Admitting: Gastroenterology

## 2018-11-14 ENCOUNTER — Telehealth: Payer: Self-pay

## 2018-11-14 NOTE — Telephone Encounter (Signed)
Attempted to reach pt. With follow-up call following endoscopic procedure 11/10/2018.  LM on pt. Ans. Machine.  Will try to reach pt. Again later today.

## 2018-11-14 NOTE — Telephone Encounter (Signed)
No answer, left message to call if having any issues or concerns, B.Chinita Schimpf RN 

## 2018-11-21 ENCOUNTER — Encounter: Payer: Self-pay | Admitting: Gastroenterology

## 2018-11-23 DIAGNOSIS — Z124 Encounter for screening for malignant neoplasm of cervix: Secondary | ICD-10-CM | POA: Diagnosis not present

## 2018-12-16 DIAGNOSIS — Z1231 Encounter for screening mammogram for malignant neoplasm of breast: Secondary | ICD-10-CM | POA: Diagnosis not present

## 2019-01-10 DIAGNOSIS — Z23 Encounter for immunization: Secondary | ICD-10-CM | POA: Diagnosis not present

## 2019-01-24 DIAGNOSIS — L738 Other specified follicular disorders: Secondary | ICD-10-CM | POA: Diagnosis not present

## 2019-01-24 DIAGNOSIS — D2261 Melanocytic nevi of right upper limb, including shoulder: Secondary | ICD-10-CM | POA: Diagnosis not present

## 2019-01-24 DIAGNOSIS — Z85828 Personal history of other malignant neoplasm of skin: Secondary | ICD-10-CM | POA: Diagnosis not present

## 2019-01-24 DIAGNOSIS — L720 Epidermal cyst: Secondary | ICD-10-CM | POA: Diagnosis not present

## 2019-01-24 DIAGNOSIS — L570 Actinic keratosis: Secondary | ICD-10-CM | POA: Diagnosis not present

## 2019-01-24 DIAGNOSIS — L218 Other seborrheic dermatitis: Secondary | ICD-10-CM | POA: Diagnosis not present

## 2019-01-24 DIAGNOSIS — L821 Other seborrheic keratosis: Secondary | ICD-10-CM | POA: Diagnosis not present

## 2019-02-07 ENCOUNTER — Other Ambulatory Visit: Payer: Self-pay

## 2019-02-07 ENCOUNTER — Inpatient Hospital Stay: Payer: Medicare Other | Attending: Oncology | Admitting: Oncology

## 2019-02-07 VITALS — BP 130/80 | HR 62 | Temp 98.2°F | Resp 17 | Ht 69.0 in | Wt 147.2 lb

## 2019-02-07 DIAGNOSIS — I10 Essential (primary) hypertension: Secondary | ICD-10-CM | POA: Diagnosis not present

## 2019-02-07 DIAGNOSIS — Z9221 Personal history of antineoplastic chemotherapy: Secondary | ICD-10-CM | POA: Insufficient documentation

## 2019-02-07 DIAGNOSIS — C49A2 Gastrointestinal stromal tumor of stomach: Secondary | ICD-10-CM | POA: Insufficient documentation

## 2019-02-07 NOTE — Progress Notes (Signed)
  [facility] OFFICE PROGRESS NOTE   Diagnosis: Gastrointestinal stromal tumor  INTERVAL HISTORY:   Hailey Daniels returns as scheduled.  She feels well.  No dysphagia.  She underwent an upper endoscopy on 11/10/2018.  Esophagitis was found at 34-35 cm from the incisors, no evidence of Barrett's esophagus.  3 sessile polyps were removed from the gastric cardia.  The pathology revealed fundic gland polyps.  No H. pylori or malignancy.  Objective:  Vital signs in last 24 hours:  Blood pressure 130/80, pulse 62, temperature 98.2 F (36.8 C), temperature source Oral, resp. rate 17, height 5\' 9"  (1.753 m), weight 147 lb 3.2 oz (66.8 kg), SpO2 99 %.   Limited physical examination secondary to distancing with the Covid pandemic HEENT: Neck without mass Lymphatics: No cervical, supraclavicular, axillary, or inguinal nodes GI: No hepatosplenomegaly, nontender, no mass Vascular: No leg edema     Medications: I have reviewed the patient's current medications.   Assessment/Plan: 1. 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 She remains in clinical remission. She completed adjuvant Gleevec therapy on the ACOSOG study. 2. History of Barretts esophagus. Last endoscopy 11/10/2018 with no evidence of Barretts esophagus or recurrent gastrointestinal stromal tumor. 3. Hypertension   Disposition: Hailey Daniels is in remission from the gastrointestinal stromal tumor.  She would like to continue follow-up at the Cancer center.  She will return for an office visit in 1 year.  She continues endoscopic surveillance with Dr. Silverio Decamp.  Betsy Coder, MD  02/07/2019  8:26 AM

## 2019-02-08 ENCOUNTER — Telehealth: Payer: Self-pay | Admitting: Oncology

## 2019-02-08 NOTE — Telephone Encounter (Signed)
Scheduled per los. Called and left msg. Mailed printout  °

## 2019-02-27 DIAGNOSIS — L821 Other seborrheic keratosis: Secondary | ICD-10-CM | POA: Diagnosis not present

## 2019-02-27 DIAGNOSIS — L570 Actinic keratosis: Secondary | ICD-10-CM | POA: Diagnosis not present

## 2019-02-27 DIAGNOSIS — L738 Other specified follicular disorders: Secondary | ICD-10-CM | POA: Diagnosis not present

## 2019-02-27 DIAGNOSIS — D1801 Hemangioma of skin and subcutaneous tissue: Secondary | ICD-10-CM | POA: Diagnosis not present

## 2019-02-27 DIAGNOSIS — Z85828 Personal history of other malignant neoplasm of skin: Secondary | ICD-10-CM | POA: Diagnosis not present

## 2019-03-03 DIAGNOSIS — E7800 Pure hypercholesterolemia, unspecified: Secondary | ICD-10-CM | POA: Diagnosis not present

## 2019-03-03 DIAGNOSIS — R7301 Impaired fasting glucose: Secondary | ICD-10-CM | POA: Diagnosis not present

## 2019-03-07 DIAGNOSIS — H401131 Primary open-angle glaucoma, bilateral, mild stage: Secondary | ICD-10-CM | POA: Diagnosis not present

## 2019-03-21 ENCOUNTER — Other Ambulatory Visit: Payer: Self-pay | Admitting: Gastroenterology

## 2019-07-05 DIAGNOSIS — D225 Melanocytic nevi of trunk: Secondary | ICD-10-CM | POA: Diagnosis not present

## 2019-07-05 DIAGNOSIS — L821 Other seborrheic keratosis: Secondary | ICD-10-CM | POA: Diagnosis not present

## 2019-07-05 DIAGNOSIS — D1801 Hemangioma of skin and subcutaneous tissue: Secondary | ICD-10-CM | POA: Diagnosis not present

## 2019-07-05 DIAGNOSIS — L570 Actinic keratosis: Secondary | ICD-10-CM | POA: Diagnosis not present

## 2019-07-05 DIAGNOSIS — D485 Neoplasm of uncertain behavior of skin: Secondary | ICD-10-CM | POA: Diagnosis not present

## 2019-07-05 DIAGNOSIS — Z85828 Personal history of other malignant neoplasm of skin: Secondary | ICD-10-CM | POA: Diagnosis not present

## 2019-07-05 DIAGNOSIS — C44719 Basal cell carcinoma of skin of left lower limb, including hip: Secondary | ICD-10-CM | POA: Diagnosis not present

## 2019-07-05 DIAGNOSIS — D0471 Carcinoma in situ of skin of right lower limb, including hip: Secondary | ICD-10-CM | POA: Diagnosis not present

## 2019-08-23 DIAGNOSIS — L650 Telogen effluvium: Secondary | ICD-10-CM | POA: Diagnosis not present

## 2019-08-23 DIAGNOSIS — Z85828 Personal history of other malignant neoplasm of skin: Secondary | ICD-10-CM | POA: Diagnosis not present

## 2019-09-05 DIAGNOSIS — H2513 Age-related nuclear cataract, bilateral: Secondary | ICD-10-CM | POA: Diagnosis not present

## 2019-09-05 DIAGNOSIS — H401131 Primary open-angle glaucoma, bilateral, mild stage: Secondary | ICD-10-CM | POA: Diagnosis not present

## 2019-09-05 DIAGNOSIS — H5213 Myopia, bilateral: Secondary | ICD-10-CM | POA: Diagnosis not present

## 2019-10-09 DIAGNOSIS — D692 Other nonthrombocytopenic purpura: Secondary | ICD-10-CM | POA: Diagnosis not present

## 2019-10-09 DIAGNOSIS — L650 Telogen effluvium: Secondary | ICD-10-CM | POA: Diagnosis not present

## 2019-10-09 DIAGNOSIS — Z85828 Personal history of other malignant neoplasm of skin: Secondary | ICD-10-CM | POA: Diagnosis not present

## 2019-10-09 DIAGNOSIS — D1801 Hemangioma of skin and subcutaneous tissue: Secondary | ICD-10-CM | POA: Diagnosis not present

## 2019-10-09 DIAGNOSIS — L821 Other seborrheic keratosis: Secondary | ICD-10-CM | POA: Diagnosis not present

## 2019-10-09 DIAGNOSIS — L570 Actinic keratosis: Secondary | ICD-10-CM | POA: Diagnosis not present

## 2019-12-21 ENCOUNTER — Telehealth: Payer: Self-pay | Admitting: Oncology

## 2019-12-21 NOTE — Telephone Encounter (Signed)
Rescheduled appointment per provider pal schedule. Called patient, no answer. Left message for patient with updated appointment time and provider.

## 2019-12-26 DIAGNOSIS — Z1231 Encounter for screening mammogram for malignant neoplasm of breast: Secondary | ICD-10-CM | POA: Diagnosis not present

## 2020-01-13 DIAGNOSIS — Z23 Encounter for immunization: Secondary | ICD-10-CM | POA: Diagnosis not present

## 2020-01-17 DIAGNOSIS — N6489 Other specified disorders of breast: Secondary | ICD-10-CM | POA: Diagnosis not present

## 2020-01-17 DIAGNOSIS — R922 Inconclusive mammogram: Secondary | ICD-10-CM | POA: Diagnosis not present

## 2020-01-23 DIAGNOSIS — Z85828 Personal history of other malignant neoplasm of skin: Secondary | ICD-10-CM | POA: Diagnosis not present

## 2020-01-23 DIAGNOSIS — L570 Actinic keratosis: Secondary | ICD-10-CM | POA: Diagnosis not present

## 2020-01-23 DIAGNOSIS — B029 Zoster without complications: Secondary | ICD-10-CM | POA: Diagnosis not present

## 2020-01-23 DIAGNOSIS — D1801 Hemangioma of skin and subcutaneous tissue: Secondary | ICD-10-CM | POA: Diagnosis not present

## 2020-01-23 DIAGNOSIS — L821 Other seborrheic keratosis: Secondary | ICD-10-CM | POA: Diagnosis not present

## 2020-01-23 DIAGNOSIS — L650 Telogen effluvium: Secondary | ICD-10-CM | POA: Diagnosis not present

## 2020-01-24 ENCOUNTER — Other Ambulatory Visit: Payer: Self-pay | Admitting: Radiology

## 2020-01-24 DIAGNOSIS — R928 Other abnormal and inconclusive findings on diagnostic imaging of breast: Secondary | ICD-10-CM | POA: Diagnosis not present

## 2020-01-24 DIAGNOSIS — Z170 Estrogen receptor positive status [ER+]: Secondary | ICD-10-CM | POA: Diagnosis not present

## 2020-01-24 DIAGNOSIS — C50412 Malignant neoplasm of upper-outer quadrant of left female breast: Secondary | ICD-10-CM | POA: Diagnosis not present

## 2020-01-26 ENCOUNTER — Telehealth: Payer: Self-pay | Admitting: *Deleted

## 2020-01-26 NOTE — Telephone Encounter (Signed)
Called to report she has been diagnosed w/breast cancer and wants Dr. Benay Spice to also treat her for this new diagnosis. Scheduled her for 02/05/20 at 2pm with Dr. Benay Spice. Requested records from [facility] from breast navigators. Patient appreciates the quick response.

## 2020-01-29 ENCOUNTER — Telehealth: Payer: Self-pay | Admitting: *Deleted

## 2020-01-29 NOTE — Telephone Encounter (Signed)
Notified by breast navigator, Dawn that patient has agreed to come to clinic on 02/07/20 and see Dr. Donne Hazel and Dr. Lindi Adie for her breast cancer diagnosis. This RN called patient to confirm since last conversation was to keep Dr. Benay Spice and not attend clinic. She reports that Dr. Isaiah Blakes has insisted it is best for her to see Dr. Lindi Adie and breast clinic, so she has agreed. Dr. Benay Spice will continue to follow her GIST diagnosis.

## 2020-01-30 ENCOUNTER — Telehealth: Payer: Self-pay | Admitting: Oncology

## 2020-01-30 DIAGNOSIS — Z23 Encounter for immunization: Secondary | ICD-10-CM | POA: Diagnosis not present

## 2020-01-30 NOTE — Telephone Encounter (Signed)
Scheduled appt per 11/1 sch msg - pt is aware of appt on 12/14

## 2020-02-01 ENCOUNTER — Encounter: Payer: Self-pay | Admitting: *Deleted

## 2020-02-01 DIAGNOSIS — C50412 Malignant neoplasm of upper-outer quadrant of left female breast: Secondary | ICD-10-CM

## 2020-02-01 DIAGNOSIS — Z170 Estrogen receptor positive status [ER+]: Secondary | ICD-10-CM | POA: Insufficient documentation

## 2020-02-02 ENCOUNTER — Encounter: Payer: Self-pay | Admitting: Internal Medicine

## 2020-02-05 ENCOUNTER — Ambulatory Visit: Payer: Medicare Other | Admitting: Oncology

## 2020-02-06 NOTE — Progress Notes (Signed)
[facility] NOTE  Patient Care Team: Tisovec, Fransico Him, MD as PCP - General (Internal Medicine) Fanny Skates, MD (General Surgery) Ladell Pier, MD as Attending Physician (Hematology and Oncology) Lafayette Dragon, MD (Inactive) (Gastroenterology) Rockwell Germany, RN as Oncology Nurse Navigator Mauro Kaufmann, RN as Oncology Nurse Navigator Rolm Bookbinder, MD as Consulting Physician (General Surgery) Nicholas Lose, MD as Consulting Physician (Hematology and Oncology) Gery Pray, MD as Consulting Physician (Radiation Oncology)  CHIEF COMPLAINTS/PURPOSE OF CONSULTATION:  Newly diagnosed breast cancer  HISTORY OF PRESENTING ILLNESS:  Hailey Daniels 74 y.o. female is here because of recent diagnosis of invasive mammary carcinoma of the left breast. Screening mammogram on 12/26/19 detected an architectural distortion in the left breast. Diagnostic mammogram on 01/17/20 showed the 1.8cm left breast architectural distortion at the 2 o'clock position, but it was not seen on US of the left breast. Biopsy on 01/24/20 showed invasive and in situ mammary carcinoma, grade 1-2, HER-2 equivocal by IHC, negative by FISH (ratio 1.28), ER/PR+ >95%, Ki67 5%. She also has a history of gastrointestinal stromal tumorof the stomach in 2040fr which she was under the care of Dr. SBenay Spice She presents to the clinic today for initial evaluation.   I reviewed her records extensively and collaborated the history with the patient.  SUMMARY OF ONCOLOGIC HISTORY: Oncology History  Malignant neoplasm of upper-outer quadrant of left breast in female, estrogen receptor positive ([facility]  02/01/2020 Initial Diagnosis   Screening mammogram detected an architectural distortion in the left breast, 1.8cm at the 2 o'clock position. Biopsy showed invasive and in situ mammary carcinoma, grade 1-2, HER-2 equivocal by IHC, negative by FISH (ratio 1.28), ER/PR+ >95%, Ki67 5%.   02/07/2020  Cancer Staging   Staging form: Breast, AJCC 8th Edition - Clinical stage from 02/07/2020: Stage IA (cT1a, cN0, cM0, G2, ER+, PR+, HER2-) - Signed by GNicholas Lose MD on 02/07/2020     MEDICAL HISTORY:  Past Medical History:  Diagnosis Date  . Barrett esophagus   . Breast cancer ([facility]   . Cancer ([facility] 2006   tumor in stomach  . Diverticulosis   . Gastrointestinal stromal tumor of stomach ([facility] 2006   no chemo/ in research study in 2006/ no meds now  . GERD (gastroesophageal reflux disease)    on meds  . Glaucoma   . Hiatal hernia   . Hypercholesteremia   . Hypertension     SURGICAL HISTORY: Past Surgical History:  Procedure Laterality Date  . BREAST BIOPSY Left   . COLONOSCOPY    . EXPLORATORY LAPAROTOMY  06/12/2004   resection of gastric tumor  . STOMACH SURGERY    . tear duct surgery     Bil/made a new opening in tear duct    SOCIAL HISTORY: Social History   Socioeconomic History  . Marital status: Married    Spouse name: Not on file  . Number of children: 3  . Years of education: Not on file  . Highest education level: Not on file  Occupational History  . Occupation: retired  Tobacco Use  . Smoking status: Never Smoker  . Smokeless tobacco: Never Used  Vaping Use  . Vaping Use: Never used  Substance and Sexual Activity  . Alcohol use: Yes    Alcohol/week: 7.0 standard drinks    Types: 7 Glasses of wine per week  . Drug use: No  . Sexual activity: Not on file  Other Topics Concern  . Not on file  Social History Narrative  . Not on file   Social Determinants of Health   Financial Resource Strain: Low Risk   . Difficulty of Paying Living Expenses: Not hard at all  Food Insecurity: No Food Insecurity  . Worried About Charity fundraiser in the Last Year: Never true  . Ran Out of Food in the Last Year: Never true  Transportation Needs: No Transportation Needs  . Lack of Transportation (Medical): No  . Lack of Transportation (Non-Medical): No    Physical Activity:   . Days of Exercise per Week: Not on file  . Minutes of Exercise per Session: Not on file  Stress:   . Feeling of Stress : Not on file  Social Connections:   . Frequency of Communication with Friends and Family: Not on file  . Frequency of Social Gatherings with Friends and Family: Not on file  . Attends Religious Services: Not on file  . Active Member of Clubs or Organizations: Not on file  . Attends Archivist Meetings: Not on file  . Marital Status: Not on file  Intimate Partner Violence:   . Fear of Current or Ex-Partner: Not on file  . Emotionally Abused: Not on file  . Physically Abused: Not on file  . Sexually Abused: Not on file    FAMILY HISTORY: Family History  Problem Relation Age of Onset  . Lung cancer Mother   . Other Father        brain tumor  . Colon cancer Neg Hx   . Pancreatic cancer Neg Hx   . Stomach cancer Neg Hx   . Rectal cancer Neg Hx   . Esophageal cancer Neg Hx     ALLERGIES:  has No Known Allergies.  MEDICATIONS:  Current Outpatient Medications  Medication Sig Dispense Refill  . atenolol (TENORMIN) 25 MG tablet Take 25 mg by mouth daily.    Marland Kitchen atorvastatin (LIPITOR) 20 MG tablet Take 1 tablet (20 mg total) by mouth daily. 30 tablet 6  . Cholecalciferol (VITAMIN D-3) 1000 units CAPS Take 2,000 Units by mouth daily.    . famotidine (PEPCID) 40 MG tablet TAKE ONE TABLET DAILY 90 tablet 3  . hydrochlorothiazide (HYDRODIURIL) 25 MG tablet Take 25 mg by mouth daily.    . Multiple Vitamin (MULTIVITAMIN WITH MINERALS) TABS tablet Take 1 tablet by mouth daily.    . Omega-3 Fatty Acids (FISH OIL) 1200 MG CPDR Take by mouth daily at 6 (six) AM.    . polycarbophil (FIBERCON) 625 MG tablet Take 625 mg by mouth daily. Take 2 pills daily    . potassium chloride (K-DUR) 10 MEQ tablet Take 10 mEq by mouth daily.    . timolol (TIMOPTIC-XR) 0.5 % ophthalmic gel-forming Apply 1 drop to eye Daily. Both eyes     Current  Facility-Administered Medications  Medication Dose Route Frequency Provider Last Rate Last Admin  . 0.9 %  sodium chloride infusion  500 mL Intravenous Once Nandigam, Venia Minks, MD        REVIEW OF SYSTEMS:     All other systems were reviewed with the patient and are negative.  PHYSICAL EXAMINATION: ECOG PERFORMANCE STATUS: 1 - Symptomatic but completely ambulatory  Vitals:   02/07/20 0908  BP: (!) 187/96  Pulse: 70  Resp: 17  Temp: 98.7 F (37.1 C)  SpO2: 97%   Filed Weights   02/07/20 0908  Weight: 143 lb 3.2 oz (65 kg)       LABORATORY DATA:  I have reviewed the data as listed Lab Results  Component Value Date   WBC 5.1 02/07/2020   HGB 15.5 (H) 02/07/2020   HCT 45.0 02/07/2020   MCV 99.8 02/07/2020   PLT 216 02/07/2020   Lab Results  Component Value Date   NA 143 02/07/2020   K 3.5 02/07/2020   CL 102 02/07/2020   CO2 31 02/07/2020    RADIOGRAPHIC STUDIES: I have personally reviewed the radiological reports and agreed with the findings in the report.  ASSESSMENT AND PLAN:  Malignant neoplasm of upper-outer quadrant of left breast in female, estrogen receptor positive ([place]) 02/01/2020:Screening mammogram detected an architectural distortion in the left breast, 1.8cm at the 2 o'clock position. Biopsy showed invasive and in situ mammary carcinoma, grade 1-2, HER-2 equivocal by IHC, negative by FISH (ratio 1.28), ER/PR+ >95%, Ki67 5%. T1 a N0 stage Ia clinical stage  Pathology and radiology counseling:Discussed with the patient, the details of pathology including the type of breast cancer,the clinical staging, the significance of ER, PR and HER-2/neu receptors and the implications for treatment. After reviewing the pathology in detail, we proceeded to discuss the different treatment options between surgery, radiation, chemotherapy, antiestrogen therapies.  Recommendations: 1. Breast conserving surgery followed by 2. Adjuvant radiation therapy followed by 3.  Adjuvant antiestrogen therapy  Based on her age and very favorable clinical profile, I did not recommend Oncotype DX testing or adjuvant chemotherapy. Return to clinic after surgery to discuss final pathology report    All questions were answered. The patient knows to call the clinic with any problems, questions or concerns.   Rulon Eisenmenger, MD, MPH 02/07/2020    I, Molly Dorshimer, am acting as scribe for Nicholas Lose, MD.  I have reviewed the above documentation for accuracy and completeness, and I agree with the above.

## 2020-02-07 ENCOUNTER — Encounter: Payer: Self-pay | Admitting: Hematology and Oncology

## 2020-02-07 ENCOUNTER — Ambulatory Visit: Payer: Medicare Other | Admitting: Physical Therapy

## 2020-02-07 ENCOUNTER — Inpatient Hospital Stay: Payer: Medicare Other | Admitting: Licensed Clinical Social Worker

## 2020-02-07 ENCOUNTER — Inpatient Hospital Stay (HOSPITAL_BASED_OUTPATIENT_CLINIC_OR_DEPARTMENT_OTHER): Payer: Medicare Other | Admitting: Hematology and Oncology

## 2020-02-07 ENCOUNTER — Inpatient Hospital Stay: Payer: Medicare Other | Attending: Hematology and Oncology

## 2020-02-07 ENCOUNTER — Ambulatory Visit
Admission: RE | Admit: 2020-02-07 | Discharge: 2020-02-07 | Disposition: A | Discharge status: Home / Self Care | Payer: Medicare Other | Source: Ambulatory Visit | Attending: Radiation Oncology | Admitting: Radiation Oncology

## 2020-02-07 ENCOUNTER — Encounter: Payer: Self-pay | Admitting: *Deleted

## 2020-02-07 ENCOUNTER — Other Ambulatory Visit: Payer: Self-pay | Admitting: General Surgery

## 2020-02-07 ENCOUNTER — Other Ambulatory Visit: Payer: Self-pay

## 2020-02-07 DIAGNOSIS — C50412 Malignant neoplasm of upper-outer quadrant of left female breast: Secondary | ICD-10-CM

## 2020-02-07 DIAGNOSIS — Z8719 Personal history of other diseases of the digestive system: Secondary | ICD-10-CM | POA: Insufficient documentation

## 2020-02-07 DIAGNOSIS — Z170 Estrogen receptor positive status [ER+]: Secondary | ICD-10-CM | POA: Diagnosis not present

## 2020-02-07 DIAGNOSIS — E7800 Pure hypercholesterolemia, unspecified: Secondary | ICD-10-CM | POA: Insufficient documentation

## 2020-02-07 DIAGNOSIS — Z79899 Other long term (current) drug therapy: Secondary | ICD-10-CM | POA: Diagnosis not present

## 2020-02-07 DIAGNOSIS — K219 Gastro-esophageal reflux disease without esophagitis: Secondary | ICD-10-CM | POA: Diagnosis not present

## 2020-02-07 DIAGNOSIS — H409 Unspecified glaucoma: Secondary | ICD-10-CM | POA: Diagnosis not present

## 2020-02-07 DIAGNOSIS — I10 Essential (primary) hypertension: Secondary | ICD-10-CM | POA: Diagnosis not present

## 2020-02-07 LAB — CBC WITH DIFFERENTIAL (CANCER CENTER ONLY)
Abs Immature Granulocytes: 0.01 10*3/uL (ref 0.00–0.07)
Basophils Absolute: 0.1 10*3/uL (ref 0.0–0.1)
Basophils Relative: 1 %
Eosinophils Absolute: 0.2 10*3/uL (ref 0.0–0.5)
Eosinophils Relative: 4 %
HCT: 45 % (ref 36.0–46.0)
Hemoglobin: 15.5 g/dL — ABNORMAL HIGH (ref 12.0–15.0)
Immature Granulocytes: 0 %
Lymphocytes Relative: 33 %
Lymphs Abs: 1.7 10*3/uL (ref 0.7–4.0)
MCH: 34.4 pg — ABNORMAL HIGH (ref 26.0–34.0)
MCHC: 34.4 g/dL (ref 30.0–36.0)
MCV: 99.8 fL (ref 80.0–100.0)
Monocytes Absolute: 0.4 10*3/uL (ref 0.1–1.0)
Monocytes Relative: 8 %
Neutro Abs: 2.7 10*3/uL (ref 1.7–7.7)
Neutrophils Relative %: 54 %
Platelet Count: 216 10*3/uL (ref 150–400)
RBC: 4.51 MIL/uL (ref 3.87–5.11)
RDW: 11.4 % — ABNORMAL LOW (ref 11.5–15.5)
WBC Count: 5.1 10*3/uL (ref 4.0–10.5)
nRBC: 0 % (ref 0.0–0.2)

## 2020-02-07 LAB — CMP (CANCER CENTER ONLY)
ALT: 36 U/L (ref 0–44)
AST: 18 U/L (ref 15–41)
Albumin: 3.9 g/dL (ref 3.5–5.0)
Alkaline Phosphatase: 60 U/L (ref 38–126)
Anion gap: 10 (ref 5–15)
BUN: 14 mg/dL (ref 8–23)
CO2: 31 mmol/L (ref 22–32)
Calcium: 10.1 mg/dL (ref 8.9–10.3)
Chloride: 102 mmol/L (ref 98–111)
Creatinine: 0.65 mg/dL (ref 0.44–1.00)
GFR, Estimated: >60 mL/min (ref >60)
Glucose, Bld: 97 mg/dL (ref 70–99)
Potassium: 3.5 mmol/L (ref 3.5–5.1)
Sodium: 143 mmol/L (ref 135–145)
Total Bilirubin: 0.8 mg/dL (ref 0.3–1.2)
Total Protein: 7.4 g/dL (ref 6.5–8.1)

## 2020-02-07 LAB — GENETIC SCREENING ORDER

## 2020-02-07 NOTE — Progress Notes (Signed)
Radiation Oncology         [phone] ________________________________  Multidisciplinary Breast Oncology Clinic [facility]) Initial Outpatient Consultation  Name: Hailey Daniels MRN: [id number]  Date: 02/07/2020  DOB: [birth year]  [id number], Hailey Him, MD  Rolm Bookbinder, MD   REFERRING PHYSICIAN: Rolm Bookbinder, MD  DIAGNOSIS: The encounter diagnosis was Malignant neoplasm of upper-outer quadrant of left breast in female, estrogen receptor positive ([place]).  Stage Stage T1a, Nx  Left Breast UOQ, Invasive Ductal Carcinoma with DCIS, ER+ / PR+ / Her2-, Grade 1-2    ICD-10-CM   1. Malignant neoplasm of upper-outer quadrant of left breast in female, estrogen receptor positive (Hailey Daniels)  C50.412    Z17.0     HISTORY OF PRESENT ILLNESS::Hailey Daniels is a 74 y.o. female who is presenting to the office today for evaluation of her newly diagnosed breast cancer. She is accompanied by a good friend. She is doing well overall.   She had routine screening mammography on 12/26/2019 that showed a possible architectural distortion in the left breast that was indeterminate. She underwent unilateral diagnostic mammography with tomography and left breast ultrasonography at [facility] on 01/17/2020 that showed a suspicious mammogram finding without ultrasound correlate.  There is distortion measured  approximately 1.5 cm.  The suspicious lesion measured approximately 3 mm  Biopsy on 01/24/2020 showed: grade 1/2 invasive mammary carcinoma with mammary carcinoma in situ. Immunohistochemistry for E-Cadherin was positive, consistent with ductal carcinoma. Prognostic indicators were significant for: estrogen receptor, >95% positive and progesterone receptor, >95% positive, both with strong staining intensities. Proliferation marker Ki67 at 5%. HER2 negative.  Menarche: 74 years old Age at first live birth: 74 years old GP: 3 LMP: Unsure Contraceptive: None HRT: Yes, for 10 years. Stopped in  approximately 2010.   The patient was referred today for presentation in the multidisciplinary conference.  Radiology studies and pathology slides were presented there for review and discussion of treatment options.  A consensus was discussed regarding potential next steps.  PREVIOUS RADIATION THERAPY: No  PAST MEDICAL HISTORY:  Past Medical History:  Diagnosis Date  . Barrett esophagus   . Breast cancer ([place])   . Cancer [facility]) 2006   tumor in stomach  . Diverticulosis   . Gastrointestinal stromal tumor of stomach ([place]) 2006   no chemo/ in research study in 2006/ no meds now  . GERD (gastroesophageal reflux disease)    on meds  . Glaucoma   . Hiatal hernia   . Hypercholesteremia   . Hypertension     PAST SURGICAL HISTORY: Past Surgical History:  Procedure Laterality Date  . BREAST BIOPSY Left   . COLONOSCOPY    . EXPLORATORY LAPAROTOMY  06/12/2004   resection of gastric tumor  . STOMACH SURGERY    . tear duct surgery     Bil/made a new opening in tear duct    FAMILY HISTORY:  Family History  Problem Relation Age of Onset  . Lung cancer Mother   . Other Father        brain tumor  . Colon cancer Neg Hx   . Pancreatic cancer Neg Hx   . Stomach cancer Neg Hx   . Rectal cancer Neg Hx   . Esophageal cancer Neg Hx     SOCIAL HISTORY:  Social History   Socioeconomic History  . Marital status: Married    Spouse name: Not on file  . Number of children: 3  . Years of education: Not on file  . Highest education  level: Not on file  Occupational History  . Occupation: retired  Tobacco Use  . Smoking status: Never Smoker  . Smokeless tobacco: Never Used  Vaping Use  . Vaping Use: Never used  Substance and Sexual Activity  . Alcohol use: Yes    Alcohol/week: 7.0 standard drinks    Types: 7 Glasses of wine per week  . Drug use: No  . Sexual activity: Not on file  Other Topics Concern  . Not on file  Social History Narrative  . Not on file   Social Determinants  of Health   Financial Resource Strain: Low Risk   . Difficulty of Paying Living Expenses: Not hard at all  Food Insecurity: No Food Insecurity  . Worried About Charity fundraiser in the Last Year: Never true  . Ran Out of Food in the Last Year: Never true  Transportation Needs: No Transportation Needs  . Lack of Transportation (Medical): No  . Lack of Transportation (Non-Medical): No  Physical Activity:   . Days of Exercise per Week: Not on file  . Minutes of Exercise per Session: Not on file  Stress:   . Feeling of Stress : Not on file  Social Connections:   . Frequency of Communication with Friends and Family: Not on file  . Frequency of Social Gatherings with Friends and Family: Not on file  . Attends Religious Services: Not on file  . Active Member of Clubs or Organizations: Not on file  . Attends Archivist Meetings: Not on file  . Marital Status: Not on file    ALLERGIES: No Known Allergies  MEDICATIONS:  Current Outpatient Medications  Medication Sig Dispense Refill  . atenolol (TENORMIN) 25 MG tablet Take 25 mg by mouth daily.    Marland Kitchen atorvastatin (LIPITOR) 20 MG tablet Take 1 tablet (20 mg total) by mouth daily. 30 tablet 6  . Cholecalciferol (VITAMIN D-3) 1000 units CAPS Take 2,000 Units by mouth daily.    . famotidine (PEPCID) 40 MG tablet TAKE ONE TABLET DAILY 90 tablet 3  . hydrochlorothiazide (HYDRODIURIL) 25 MG tablet Take 25 mg by mouth daily.    . Multiple Vitamin (MULTIVITAMIN WITH MINERALS) TABS tablet Take 1 tablet by mouth daily.    . Omega-3 Fatty Acids (FISH OIL) 1200 MG CPDR Take by mouth daily at 6 (six) AM.    . polycarbophil (FIBERCON) 625 MG tablet Take 625 mg by mouth daily. Take 2 pills daily    . potassium chloride (K-DUR) 10 MEQ tablet Take 10 mEq by mouth daily.    . timolol (TIMOPTIC-XR) 0.5 % ophthalmic gel-forming Apply 1 drop to eye Daily. Both eyes     Current Facility-Administered Medications  Medication Dose Route Frequency  Provider Last Rate Last Admin  . 0.9 %  sodium chloride infusion  500 mL Intravenous Once Nandigam, Venia Minks, MD        REVIEW OF SYSTEMS: A 10+ POINT REVIEW OF SYSTEMS WAS OBTAINED including neurology, dermatology, psychiatry, cardiac, respiratory, lymph, extremities, GI, GU, musculoskeletal, constitutional, reproductive, HEENT. On the provided form, she reports wearing contacts, history of skin cancer, anxiety, and mild hot flashes. She denies nausea, vomiting, diarrhea, abdominal pain, chest pain, shortness of breath, cough, dysuria, breast pain, and any other symptoms.    PHYSICAL EXAM:   Vitals with BMI 02/07/2020  Height '5\' 9"'   Weight 143 lbs 3 oz  BMI 56.81  Systolic 275  Diastolic 96  Pulse 70   Lungs are clear to auscultation  bilaterally. Heart has regular rate and rhythm. No palpable cervical, supraclavicular, or axillary adenopathy. Abdomen soft, non-tender, normal bowel sounds. Right breast with no palpable mass, nipple discharge, or bleeding.  Left breast with a small biopsy site in the upper outer quadrant. No palpable mass, nipple discharge, or bleeding.  KPS = 90  100 - Normal; no complaints; no evidence of disease. 90   - Able to carry on normal activity; minor signs or symptoms of disease. 80   - Normal activity with effort; some signs or symptoms of disease. 15   - Cares for self; unable to carry on normal activity or to do active work. 60   - Requires occasional assistance, but is able to care for most of his personal needs. 50   - Requires considerable assistance and frequent medical care. 21   - Disabled; requires special care and assistance. 37   - Severely disabled; hospital admission is indicated although death not imminent. 23   - Very sick; hospital admission necessary; active supportive treatment necessary. 10   - Moribund; fatal processes progressing rapidly. 0     - Dead  Karnofsky DA, Abelmann [place], Craver LS and Burchenal JH [phone]) The use of the  nitrogen mustards in the palliative treatment of carcinoma: with particular reference to bronchogenic carcinoma Cancer 1 [phone]  LABORATORY DATA:  Lab Results  Component Value Date   WBC 5.1 02/07/2020   HGB 15.5 (H) 02/07/2020   HCT 45.0 02/07/2020   MCV 99.8 02/07/2020   PLT 216 02/07/2020   Lab Results  Component Value Date   NA 143 02/07/2020   K 3.5 02/07/2020   CL 102 02/07/2020   CO2 31 02/07/2020   Lab Results  Component Value Date   ALT 36 02/07/2020   AST 18 02/07/2020   ALKPHOS 60 02/07/2020   BILITOT 0.8 02/07/2020    PULMONARY FUNCTION TEST:   Recent Review Flowsheet Data   There is no flowsheet data to display.     RADIOGRAPHY: No results found.    IMPRESSION: Stage T1a, Nx, Mx, Left Breast UOQ, Invasive Ductal Carcinoma with DCIS, ER+ / PR+ / Her2-, Grade 1-2  The patient will be a good candidate for breast conservation with radiotherapy to the left breast. We discussed the general course of radiation, potential side effects, and toxicities with radiation and the patient is interested in this approach.  Of note, patient appears to have an excellent prognosis and she may do well with adjuvant hormonal therapy alone and may be able to omit radiation therapy. we will wait on the final pathology to make that determination.    PLAN:  1. Left lumpectomy, sentinel node procedure not planned in light of her excellent prognosis 2. Adjuvant radiation therapy depending on final pathologic findings and patient decision 3. Aromatase inhibitor   ------------------------------------------------  Blair Promise, PhD, MD  This document serves as a record of services personally performed by Gery Pray, MD. It was created on his behalf by Clerance Lav, a trained medical scribe. The creation of this record is based on the scribe's personal observations and the provider's statements to them. This document has been checked and approved by the attending provider.

## 2020-02-07 NOTE — Progress Notes (Signed)
[place] Work  Initial Assessment   Hailey Daniels is a 74 y.o. year old female accompanied by patient and friend, Truman Hayward. Clinical Social Work was referred by [facility] for assessment of psychosocial needs.   SDOH (Social Determinants of Health) assessments performed: Yes SDOH Interventions     Most Recent Value  SDOH Interventions  Food Insecurity Interventions Intervention Not Indicated  Financial Strain Interventions Intervention Not Indicated  Housing Interventions Intervention Not Indicated  Transportation Interventions Intervention Not Indicated      Distress Screen completed: Yes ONCBCN DISTRESS SCREENING 02/07/2020  Screening Type Initial Screening  Distress experienced in past week (1-10) 3  Emotional problem type Depression;Nervousness/Anxiety;Adjusting to illness  Physical Problem type Sleep/insomnia      Family/Social Information:  . Housing Arrangement: patient lives with husband, Eddie Dibbles . Family members/support persons in your life? Family (husband, 3 adult children live in other states) and Friends . Transportation concerns: no  . 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 Financial concerns: No o Type of concern: None . Food access concerns: no . Religious or spiritual practice: yes, [employer] . Medication Concerns: no  . Services Currently in place:  n/a  Coping/ Adjustment to diagnosis: . Patient understands treatment plan and what happens next? yes . Concerns about diagnosis and/or treatment: General nervousness around diagnosis and treatment . Patient reported stressors: Anxiety . Patient enjoys walking, cooking, listening to books. These have been helpful in managing nervousness/ anxiety in the past . Current coping skills/ strengths: Average or above average intelligence, Capable of independent living, Financial means, Special hobby/interest and Supportive family/friends    SUMMARY: Current SDOH Barriers:   . none noted today  Clinical Social Work Clinical Goal(s):  Marland Kitchen Patient will continue to attend medical appts as recommended by medical team  Interventions: . Discussed common feeling and emotions when being diagnosed with cancer, and the importance of support during treatment . Informed patient of the support team roles and support services at [facility] . Provided CSW contact information and encouraged patient to call with any questions or concerns   Follow Up Plan: Patient will contact CSW with any support or resource needs Patient verbalizes understanding of plan: Yes   Christeen Douglas LCSW

## 2020-02-07 NOTE — Assessment & Plan Note (Signed)
02/01/2020:Screening mammogram detected an architectural distortion in the left breast, 1.8cm at the 2 o'clock position. Biopsy showed invasive and in situ mammary carcinoma, grade 1-2, HER-2 equivocal by IHC, negative by FISH (ratio 1.28), ER/PR+ >95%, Ki67 5%. T1 a N0 stage Ia clinical stage  Pathology and radiology counseling:Discussed with the patient, the details of pathology including the type of breast cancer,the clinical staging, the significance of ER, PR and HER-2/neu receptors and the implications for treatment. After reviewing the pathology in detail, we proceeded to discuss the different treatment options between surgery, radiation, chemotherapy, antiestrogen therapies.  Recommendations: 1. Breast conserving surgery followed by 2. Adjuvant radiation therapy followed by 3. Adjuvant antiestrogen therapy  Return to clinic after surgery to discuss final pathology report

## 2020-02-08 ENCOUNTER — Ambulatory Visit: Payer: Medicare Other | Admitting: Oncology

## 2020-02-08 ENCOUNTER — Ambulatory Visit: Payer: Medicare Other | Admitting: Nurse Practitioner

## 2020-02-14 ENCOUNTER — Encounter: Payer: Self-pay | Admitting: *Deleted

## 2020-02-14 ENCOUNTER — Telehealth: Payer: Self-pay | Admitting: *Deleted

## 2020-02-14 DIAGNOSIS — C50412 Malignant neoplasm of upper-outer quadrant of left female breast: Secondary | ICD-10-CM

## 2020-02-14 NOTE — Telephone Encounter (Signed)
Spoke to pt concerning [place] from 11.10.21. Denies questions or concerns regarding dx or treatment care plan. Encourage pt to call with needs. Received verbal understanding.

## 2020-02-21 NOTE — Progress Notes (Signed)
Nutrition  Patient identified by attending Breast Clinic on 02/07/20.  Patient was given nutrition packet with RD contact information by nurse navigator.    Chart reviewed.   74 year old female with breast cancer.  Planning lumpectomy, radiation and antiestrogens.    Ht: 69 inches Wt: 143 lb BMI: 21  Patient currently not at nutritional risk.  Please consult RD if changes in nutritional status occur.  Waldo Damian B. Zenia Resides, [place], [place] Registered Dietitian [phone] (mobile)

## 2020-02-27 ENCOUNTER — Encounter: Payer: Self-pay | Admitting: *Deleted

## 2020-03-05 DIAGNOSIS — H2513 Age-related nuclear cataract, bilateral: Secondary | ICD-10-CM | POA: Diagnosis not present

## 2020-03-05 DIAGNOSIS — H02052 Trichiasis without entropian right lower eyelid: Secondary | ICD-10-CM | POA: Diagnosis not present

## 2020-03-05 DIAGNOSIS — H401131 Primary open-angle glaucoma, bilateral, mild stage: Secondary | ICD-10-CM | POA: Diagnosis not present

## 2020-03-06 ENCOUNTER — Other Ambulatory Visit: Payer: Self-pay

## 2020-03-06 ENCOUNTER — Encounter (HOSPITAL_BASED_OUTPATIENT_CLINIC_OR_DEPARTMENT_OTHER): Payer: Self-pay | Admitting: General Surgery

## 2020-03-07 ENCOUNTER — Encounter (HOSPITAL_BASED_OUTPATIENT_CLINIC_OR_DEPARTMENT_OTHER)
Admission: RE | Admit: 2020-03-07 | Discharge: 2020-03-07 | Disposition: A | Discharge status: Home / Self Care | Payer: Medicare Other | Source: Ambulatory Visit | Attending: General Surgery | Admitting: General Surgery

## 2020-03-07 DIAGNOSIS — Z01818 Encounter for other preprocedural examination: Secondary | ICD-10-CM | POA: Diagnosis not present

## 2020-03-07 LAB — BASIC METABOLIC PANEL
Anion gap: 11 (ref 5–15)
BUN: 10 mg/dL (ref 8–23)
CO2: 27 mmol/L (ref 22–32)
Calcium: 9.6 mg/dL (ref 8.9–10.3)
Chloride: 100 mmol/L (ref 98–111)
Creatinine, Ser: 0.6 mg/dL (ref 0.44–1.00)
GFR, Estimated: >60 mL/min (ref >60)
Glucose, Bld: 85 mg/dL (ref 70–99)
Potassium: 4.3 mmol/L (ref 3.5–5.1)
Sodium: 138 mmol/L (ref 135–145)

## 2020-03-07 MED ORDER — ENSURE PRE-SURGERY PO LIQD: 296.0000 mL | Freq: Once | ORAL | Status: DC

## 2020-03-07 NOTE — Progress Notes (Signed)

## 2020-03-11 ENCOUNTER — Other Ambulatory Visit (HOSPITAL_COMMUNITY)
Admission: RE | Admit: 2020-03-11 | Discharge: 2020-03-11 | Disposition: A | Discharge status: Home / Self Care | Payer: Medicare Other | Source: Ambulatory Visit | Attending: General Surgery | Admitting: General Surgery

## 2020-03-11 DIAGNOSIS — Z01812 Encounter for preprocedural laboratory examination: Secondary | ICD-10-CM | POA: Insufficient documentation

## 2020-03-11 DIAGNOSIS — Z20822 Contact with and (suspected) exposure to covid-19: Secondary | ICD-10-CM | POA: Insufficient documentation

## 2020-03-12 ENCOUNTER — Ambulatory Visit: Payer: Medicare Other | Admitting: Oncology

## 2020-03-12 LAB — SARS CORONAVIRUS 2 (TAT 6-24 HRS): SARS Coronavirus 2: NEGATIVE

## 2020-03-13 ENCOUNTER — Encounter (HOSPITAL_BASED_OUTPATIENT_CLINIC_OR_DEPARTMENT_OTHER): Payer: Self-pay | Admitting: General Surgery

## 2020-03-13 ENCOUNTER — Ambulatory Visit (HOSPITAL_BASED_OUTPATIENT_CLINIC_OR_DEPARTMENT_OTHER): Payer: Medicare Other | Admitting: Anesthesiology

## 2020-03-13 ENCOUNTER — Other Ambulatory Visit: Payer: Self-pay

## 2020-03-13 ENCOUNTER — Encounter (HOSPITAL_BASED_OUTPATIENT_CLINIC_OR_DEPARTMENT_OTHER)
Admission: RE | Disposition: A | Discharge status: Home / Self Care | Payer: Self-pay | Source: Home / Self Care | Attending: General Surgery

## 2020-03-13 ENCOUNTER — Ambulatory Visit (HOSPITAL_BASED_OUTPATIENT_CLINIC_OR_DEPARTMENT_OTHER)
Admission: RE | Admit: 2020-03-13 | Discharge: 2020-03-13 | Disposition: A | Discharge status: Home / Self Care | Payer: Medicare Other | Attending: General Surgery | Admitting: General Surgery

## 2020-03-13 DIAGNOSIS — Z170 Estrogen receptor positive status [ER+]: Secondary | ICD-10-CM | POA: Diagnosis not present

## 2020-03-13 DIAGNOSIS — N6489 Other specified disorders of breast: Secondary | ICD-10-CM | POA: Diagnosis not present

## 2020-03-13 DIAGNOSIS — I10 Essential (primary) hypertension: Secondary | ICD-10-CM | POA: Diagnosis not present

## 2020-03-13 DIAGNOSIS — C50412 Malignant neoplasm of upper-outer quadrant of left female breast: Secondary | ICD-10-CM | POA: Insufficient documentation

## 2020-03-13 DIAGNOSIS — C50912 Malignant neoplasm of unspecified site of left female breast: Secondary | ICD-10-CM | POA: Diagnosis not present

## 2020-03-13 DIAGNOSIS — E7800 Pure hypercholesterolemia, unspecified: Secondary | ICD-10-CM | POA: Diagnosis not present

## 2020-03-13 HISTORY — PX: BREAST LUMPECTOMY WITH RADIOACTIVE SEED LOCALIZATION: SHX6424

## 2020-03-13 SURGERY — BREAST LUMPECTOMY WITH RADIOACTIVE SEED LOCALIZATION
Anesthesia: General | Site: Breast | Laterality: Left

## 2020-03-13 MED ORDER — LIDOCAINE HCL (CARDIAC) PF 100 MG/5ML IV SOSY
PREFILLED_SYRINGE | INTRAVENOUS | Status: DC | PRN
  Administered 2020-03-13: 80 mg via INTRAVENOUS

## 2020-03-13 MED ORDER — ONDANSETRON HCL 4 MG/2ML IJ SOLN: 4.0000 mg | Freq: Once | INTRAMUSCULAR | Status: DC | PRN

## 2020-03-13 MED ORDER — EPHEDRINE 5 MG/ML INJ
INTRAVENOUS | Status: AC
  Filled 2020-03-13: qty 10

## 2020-03-13 MED ORDER — PROPOFOL 10 MG/ML IV BOLUS
INTRAVENOUS | Status: DC | PRN
  Administered 2020-03-13: 120 mg via INTRAVENOUS

## 2020-03-13 MED ORDER — CEFAZOLIN SODIUM-DEXTROSE 2-4 GM/100ML-% IV SOLN
INTRAVENOUS | Status: AC
  Filled 2020-03-13: qty 100

## 2020-03-13 MED ORDER — EPHEDRINE SULFATE 50 MG/ML IJ SOLN
INTRAMUSCULAR | Status: DC | PRN
  Administered 2020-03-13 (×2): 5 mg via INTRAVENOUS
  Administered 2020-03-13: 10 mg via INTRAVENOUS

## 2020-03-13 MED ORDER — DEXAMETHASONE SODIUM PHOSPHATE 10 MG/ML IJ SOLN
INTRAMUSCULAR | Status: DC | PRN
  Administered 2020-03-13: 5 mg via INTRAVENOUS

## 2020-03-13 MED ORDER — ACETAMINOPHEN 500 MG PO TABS
1000.0000 mg | ORAL_TABLET | ORAL | Status: AC
  Administered 2020-03-13: 10:00:00 1000 mg via ORAL

## 2020-03-13 MED ORDER — MIDAZOLAM HCL 2 MG/2ML IJ SOLN
INTRAMUSCULAR | Status: AC
  Filled 2020-03-13: qty 2

## 2020-03-13 MED ORDER — ONDANSETRON HCL 4 MG/2ML IJ SOLN
INTRAMUSCULAR | Status: AC
  Filled 2020-03-13: qty 2

## 2020-03-13 MED ORDER — CEFAZOLIN SODIUM-DEXTROSE 2-4 GM/100ML-% IV SOLN
2.0000 g | INTRAVENOUS | Status: AC
  Administered 2020-03-13: 11:00:00 2 g via INTRAVENOUS

## 2020-03-13 MED ORDER — FENTANYL CITRATE (PF) 100 MCG/2ML IJ SOLN
INTRAMUSCULAR | Status: DC | PRN
  Administered 2020-03-13: 25 ug via INTRAVENOUS

## 2020-03-13 MED ORDER — LACTATED RINGERS IV SOLN: INTRAVENOUS | Status: DC

## 2020-03-13 MED ORDER — ONDANSETRON HCL 4 MG/2ML IJ SOLN
INTRAMUSCULAR | Status: DC | PRN
  Administered 2020-03-13: 4 mg via INTRAVENOUS

## 2020-03-13 MED ORDER — OXYCODONE HCL 5 MG PO TABS: 5.0000 mg | ORAL_TABLET | Freq: Once | ORAL | Status: DC | PRN

## 2020-03-13 MED ORDER — ACETAMINOPHEN 500 MG PO TABS
ORAL_TABLET | ORAL | Status: AC
  Filled 2020-03-13: qty 2

## 2020-03-13 MED ORDER — DEXAMETHASONE SODIUM PHOSPHATE 10 MG/ML IJ SOLN
INTRAMUSCULAR | Status: AC
  Filled 2020-03-13: qty 1

## 2020-03-13 MED ORDER — OXYCODONE HCL 5 MG/5ML PO SOLN: 5.0000 mg | Freq: Once | ORAL | Status: DC | PRN

## 2020-03-13 MED ORDER — LIDOCAINE 2% (20 MG/ML) 5 ML SYRINGE
INTRAMUSCULAR | Status: AC
  Filled 2020-03-13: qty 5

## 2020-03-13 MED ORDER — FENTANYL CITRATE (PF) 100 MCG/2ML IJ SOLN
INTRAMUSCULAR | Status: AC
  Filled 2020-03-13: qty 2

## 2020-03-13 MED ORDER — FENTANYL CITRATE (PF) 100 MCG/2ML IJ SOLN: 25.0000 ug | INTRAMUSCULAR | Status: DC | PRN

## 2020-03-13 MED ORDER — PROPOFOL 10 MG/ML IV BOLUS
INTRAVENOUS | Status: AC
  Filled 2020-03-13: qty 20

## 2020-03-13 MED ORDER — BUPIVACAINE HCL (PF) 0.25 % IJ SOLN
INTRAMUSCULAR | Status: DC | PRN
  Administered 2020-03-13: 8 mL

## 2020-03-13 SURGICAL SUPPLY — 60 items
ADH SKN CLS APL DERMABOND .7 (GAUZE/BANDAGES/DRESSINGS) ×1
APL PRP STRL LF DISP 70% ISPRP (MISCELLANEOUS) ×1
APPLIER CLIP 9.375 MED OPEN (MISCELLANEOUS)
APR CLP MED 9.3 20 MLT OPN (MISCELLANEOUS)
BINDER BREAST LRG (GAUZE/BANDAGES/DRESSINGS) ×3 IMPLANT
BINDER BREAST MEDIUM (GAUZE/BANDAGES/DRESSINGS) IMPLANT
BINDER BREAST XLRG (GAUZE/BANDAGES/DRESSINGS) IMPLANT
BINDER BREAST XXLRG (GAUZE/BANDAGES/DRESSINGS) IMPLANT
BLADE SURG 15 STRL LF DISP TIS (BLADE) ×1 IMPLANT
BLADE SURG 15 STRL SS (BLADE) ×3
CANISTER SUC SOCK COL 7IN (MISCELLANEOUS) IMPLANT
CANISTER SUCT 1200ML W/VALVE (MISCELLANEOUS) IMPLANT
CHLORAPREP W/TINT 26 (MISCELLANEOUS) ×3 IMPLANT
CLIP APPLIE 9.375 MED OPEN (MISCELLANEOUS) IMPLANT
CLIP VESOCCLUDE SM WIDE 6/CT (CLIP) IMPLANT
CLOSURE WOUND 1/2 X4 (GAUZE/BANDAGES/DRESSINGS) ×1
COVER BACK TABLE 60X90IN (DRAPES) ×3 IMPLANT
COVER MAYO STAND STRL (DRAPES) ×3 IMPLANT
COVER PROBE W GEL 5X96 (DRAPES) ×3 IMPLANT
COVER WAND RF STERILE (DRAPES) IMPLANT
DECANTER SPIKE VIAL GLASS SM (MISCELLANEOUS) IMPLANT
DERMABOND ADVANCED (GAUZE/BANDAGES/DRESSINGS) ×2
DERMABOND ADVANCED .7 DNX12 (GAUZE/BANDAGES/DRESSINGS) ×1 IMPLANT
DRAPE GAMMA PROBE CRDLSS 10X38 (DRAPES) ×3 IMPLANT
DRAPE LAPAROSCOPIC ABDOMINAL (DRAPES) ×3 IMPLANT
DRAPE UTILITY XL STRL (DRAPES) ×3 IMPLANT
DRSG TEGADERM 4X4.75 (GAUZE/BANDAGES/DRESSINGS) IMPLANT
ELECT COATED BLADE 2.86 ST (ELECTRODE) ×3 IMPLANT
ELECT REM PT RETURN 9FT ADLT (ELECTROSURGICAL) ×3
ELECTRODE REM PT RTRN 9FT ADLT (ELECTROSURGICAL) ×1 IMPLANT
GAUZE SPONGE 4X4 12PLY STRL LF (GAUZE/BANDAGES/DRESSINGS) IMPLANT
GLOVE BIO SURGEON STRL SZ7 (GLOVE) ×6 IMPLANT
GLOVE BIOGEL PI IND STRL 7.5 (GLOVE) ×1 IMPLANT
GLOVE BIOGEL PI INDICATOR 7.5 (GLOVE) ×2
GOWN STRL REUS W/ TWL LRG LVL3 (GOWN DISPOSABLE) ×3 IMPLANT
GOWN STRL REUS W/TWL LRG LVL3 (GOWN DISPOSABLE) ×9
HEMOSTAT ARISTA ABSORB 3G PWDR (HEMOSTASIS) IMPLANT
KIT MARKER MARGIN INK (KITS) ×3 IMPLANT
NEEDLE HYPO 25X1 1.5 SAFETY (NEEDLE) ×3 IMPLANT
NS IRRIG 1000ML POUR BTL (IV SOLUTION) IMPLANT
PACK BASIN DAY SURGERY FS (CUSTOM PROCEDURE TRAY) ×3 IMPLANT
PENCIL SMOKE EVACUATOR (MISCELLANEOUS) ×3 IMPLANT
RETRACTOR ONETRAX LX 90X20 (MISCELLANEOUS) IMPLANT
SLEEVE SCD COMPRESS KNEE MED (MISCELLANEOUS) ×3 IMPLANT
SPONGE LAP 4X18 RFD (DISPOSABLE) ×3 IMPLANT
STRIP CLOSURE SKIN 1/2X4 (GAUZE/BANDAGES/DRESSINGS) ×2 IMPLANT
SUT MNCRL AB 4-0 PS2 18 (SUTURE) ×3 IMPLANT
SUT MON AB 5-0 PS2 18 (SUTURE) IMPLANT
SUT SILK 2 0 SH (SUTURE) IMPLANT
SUT VIC AB 2-0 SH 27 (SUTURE) ×3
SUT VIC AB 2-0 SH 27XBRD (SUTURE) ×1 IMPLANT
SUT VIC AB 3-0 SH 27 (SUTURE) ×3
SUT VIC AB 3-0 SH 27X BRD (SUTURE) ×1 IMPLANT
SUT VIC AB 5-0 PS2 18 (SUTURE) IMPLANT
SYR CONTROL 10ML LL (SYRINGE) ×3 IMPLANT
TOWEL GREEN STERILE FF (TOWEL DISPOSABLE) ×3 IMPLANT
TRAY FAXITRON CT DISP (TRAY / TRAY PROCEDURE) ×3 IMPLANT
TUBE CONNECTING 20'X1/4 (TUBING)
TUBE CONNECTING 20X1/4 (TUBING) IMPLANT
YANKAUER SUCT BULB TIP NO VENT (SUCTIONS) IMPLANT

## 2020-03-13 NOTE — Op Note (Signed)
Preoperative diagnosis: Clinical stage I left breast cancer Postoperative diagnosis: Same as above Procedure: Left breast radioactive seed guided lumpectomy Surgeon: Dr. Serita Grammes Anesthesia: General Estimated blood loss: Minimal Complications: None Drains: None Specimens: Left breast tissue marked with paint containing seed and clip Disposition to recovery stable condition Sponge needle count was correct at completion  Hailey Daniels yof who has prior benign left breast excisional biopsy, no family history, no mass or dc. underwent her routine screening mm that shows left uoq architectural distortion. There is 3 mm mass associated with a 1.5 cm distortion. no [place] correlate. ax negative. she has biopsy that shows grade I-II IDC with DCIS that is >95% er/pr pos , her 2 negative, Ki is 15%.  We discussed lumpectomy.  Procedure: After informed consent was obtained the patient first had a radioactive seed placed.  I had these mammograms in the operating room.  She was given antibiotics.  SCDs were in place.  She was placed under general anesthesia without complication.  She was prepped and draped in the standard sterile surgical fashion.  Surgical timeout was then performed.  Identified the seed in the lateral left breast.  I infiltrated Marcaine throughout this area.  Due to the fact that the counts were fairly high near the skin I elected to make an incision immediately overlying the seed.  I made a curvilinear incision.  I then created flaps.  I then remove the seed and the surrounding tissue with an attempt to get a clear margin.  Mammogram showed the seed and the clip to be in the center of the specimen.  I then obtained hemostasis.  I closed this with 2-0 Vicryl.  The breast skin was closed with 3-0 Vicryl and 4-0 Monocryl.  Glue and Steri-Strips were applied.  A binder was placed.  She tolerated this well was extubated and transferred to recovery stable.

## 2020-03-13 NOTE — Discharge Instructions (Signed)
[place] Office Phone Number [phone]  BREAST BIOPSY/ PARTIAL MASTECTOMY: POST OP INSTRUCTIONS Take 400 mg of ibuprofen every 8 hours or 650 mg tylenol every 6 hours for next 72 hours then as needed. Use ice several times daily also. Always review your discharge instruction sheet given to you by the facility where your surgery was performed.  IF YOU HAVE DISABILITY OR FAMILY LEAVE FORMS, YOU MUST BRING THEM TO THE OFFICE FOR PROCESSING.  DO NOT GIVE THEM TO YOUR DOCTOR.  1. A prescription for pain medication may be given to you upon discharge.  Take your pain medication as prescribed, if needed.  If narcotic pain medicine is not needed, then you may take acetaminophen (Tylenol), naprosyn (Alleve) or ibuprofen (Advil) as needed. 2. Take your usually prescribed medications unless otherwise directed 3. If you need a refill on your pain medication, please contact your pharmacy.  They will contact our office to request authorization.  Prescriptions will not be filled after 5pm or on week-ends. 4. You should eat very light the first 24 hours after surgery, such as soup, crackers, pudding, etc.  Resume your normal diet the day after surgery. 5. Most patients will experience some swelling and bruising in the breast.  Ice packs and a good support bra will help.  Wear the breast binder provided or a sports bra for 72 hours day and night.  After that wear a sports bra during the day until you return to the office. Swelling and bruising can take several days to resolve.  6. It is common to experience some constipation if taking pain medication after surgery.  Increasing fluid intake and taking a stool softener will usually help or prevent this problem from occurring.  A mild laxative (Milk of Magnesia or Miralax) should be taken according to package directions if there are no bowel movements after 48 hours. 7. Unless discharge instructions indicate otherwise, you may remove your bandages 48  hours after surgery and you may shower at that time.  You may have steri-strips (small skin tapes) in place directly over the incision.  These strips should be left on the skin for 7-10 days and will come off on their own.  If your surgeon used skin glue on the incision, you may shower in 24 hours.  The glue will flake off over the next 2-3 weeks.  Any sutures or staples will be removed at the office during your follow-up visit. 8. ACTIVITIES:  You may resume regular daily activities (gradually increasing) beginning the next day.  Wearing a good support bra or sports bra minimizes pain and swelling.  You may have sexual intercourse when it is comfortable. a. You may drive when you no longer are taking prescription pain medication, you can comfortably wear a seatbelt, and you can safely maneuver your car and apply brakes. b. RETURN TO WORK:  ______________________________________________________________________________________ 9. You should see your doctor in the office for a follow-up appointment approximately two weeks after your surgery.  Your doctors nurse will typically make your follow-up appointment when she calls you with your pathology report.  Expect your pathology report 3-4 business days after your surgery.  You may call to check if you do not hear from [place] after three days. 10. OTHER INSTRUCTIONS: _______________________________________________________________________________________________ _____________________________________________________________________________________________________________________________________ _____________________________________________________________________________________________________________________________________ _____________________________________________________________________________________________________________________________________  WHEN TO CALL DR WAKEFIELD: 1. Fever over 101.0 2. Nausea and/or vomiting. 3. Extreme swelling or  bruising. 4. Continued bleeding from incision. 5. Increased pain, redness, or drainage from the incision.  The clinic  staff is available to answer your questions during regular business hours.  Please dont hesitate to call and ask to speak to one of the nurses for clinical concerns.  If you have a medical emergency, go to the nearest emergency room or call 911.  A surgeon from [facility] is always on call at the hospital.  For further questions, please visit [email or web address]  No Tylenol until 4:19 pm   Post Anesthesia Home Care Instructions  Activity: Get plenty of rest for the remainder of the day. A responsible individual must stay with you for 24 hours following the procedure.  For the next 24 hours, DO NOT: -Drive a car -Paediatric nurse -Drink alcoholic beverages -Take any medication unless instructed by your physician -Make any legal decisions or sign important papers.  Meals: Start with liquid foods such as gelatin or soup. Progress to regular foods as tolerated. Avoid greasy, spicy, heavy foods. If nausea and/or vomiting occur, drink only clear liquids until the nausea and/or vomiting subsides. Call your physician if vomiting continues.  Special Instructions/Symptoms: Your throat may feel dry or sore from the anesthesia or the breathing tube placed in your throat during surgery. If this causes discomfort, gargle with warm salt water. The discomfort should disappear within 24 hours.  If you had a scopolamine patch placed behind your ear for the management of post- operative nausea and/or vomiting:  1. The medication in the patch is effective for 72 hours, after which it should be removed.  Wrap patch in a tissue and discard in the trash. Wash hands thoroughly with soap and water. 2. You may remove the patch earlier than 72 hours if you experience unpleasant side effects which may include dry mouth, dizziness or visual disturbances. 3. Avoid  touching the patch. Wash your hands with soap and water after contact with the patch.

## 2020-03-13 NOTE — Interval H&P Note (Signed)
History and Physical Interval Note:  03/13/2020 10:44 AM  Hailey Daniels  has presented today for surgery, with the diagnosis of LEFT BREAST CANCER.  The various methods of treatment have been discussed with the patient and family. After consideration of risks, benefits and other options for treatment, the patient has consented to  Procedure(s): LEFT BREAST LUMPECTOMY WITH RADIOACTIVE SEED LOCALIZATION (Left) as a surgical intervention.  The patient's history has been reviewed, patient examined, no change in status, stable for surgery.  I have reviewed the patient's chart and labs.  Questions were answered to the patient's satisfaction.     Rolm Bookbinder

## 2020-03-13 NOTE — Anesthesia Preprocedure Evaluation (Addendum)
Anesthesia Evaluation  Patient identified by MRN, date of birth, ID band Patient awake    Reviewed: Allergy & Precautions, NPO status , Patient's Chart, lab work & pertinent test results  Airway Mallampati: II  TM Distance: >3 FB Neck ROM: Full    Dental no notable dental hx. (+) Teeth Intact   Pulmonary neg pulmonary ROS,    Pulmonary exam normal breath sounds clear to auscultation       Cardiovascular hypertension, Pt. on medications and Pt. on home beta blockers Normal cardiovascular exam Rhythm:Regular Rate:Normal  Hx/o Cardiomyopathy EKG 03/07/20 NSR, LAD, LBBB pattern unchanged from 2014   Neuro/Psych Glaucoma  Neuromuscular disease negative psych ROS   GI/Hepatic Neg liver ROS, hiatal hernia, GERD  Medicated and Controlled,Hx/o Barrett's esophagus Hx/o gastric tumor   Endo/Other  Left Breast Ca Hypercholesterolemia  Renal/GU negative Renal ROS  negative genitourinary   Musculoskeletal negative musculoskeletal ROS (+)   Abdominal   Peds  Hematology negative hematology ROS (+)   Anesthesia Other Findings   Reproductive/Obstetrics                            Anesthesia Physical Anesthesia Plan  ASA: III  Anesthesia Plan: General   Post-op Pain Management:    Induction: Intravenous  PONV Risk Score and Plan: 4 or greater and Ondansetron and Treatment may vary due to age or medical condition  Airway Management Planned: LMA  Additional Equipment:   Intra-op Plan:   Post-operative Plan: Extubation in OR  Informed Consent: I have reviewed the patients History and Physical, chart, labs and discussed the procedure including the risks, benefits and alternatives for the proposed anesthesia with the patient or authorized representative who has indicated his/her understanding and acceptance.     Dental advisory given  Plan Discussed with: CRNA and Anesthesiologist  Anesthesia  Plan Comments:         Anesthesia Quick Evaluation

## 2020-03-13 NOTE — Anesthesia Procedure Notes (Signed)
Procedure Name: LMA Insertion Date/Time: 03/13/2020 11:20 AM Performed by: Lavonia Dana, CRNA Pre-anesthesia Checklist: Patient identified, Emergency Drugs available, Suction available and Patient being monitored Patient Re-evaluated:Patient Re-evaluated prior to induction Oxygen Delivery Method: Circle system utilized Preoxygenation: Pre-oxygenation with 100% oxygen Induction Type: IV induction Ventilation: Mask ventilation without difficulty LMA: LMA inserted LMA Size: 4.0 Number of attempts: 1 Airway Equipment and Method: Bite block Placement Confirmation: positive ETCO2 Tube secured with: Tape Dental Injury: Teeth and Oropharynx as per pre-operative assessment

## 2020-03-13 NOTE — Anesthesia Postprocedure Evaluation (Signed)
Anesthesia Post Note  Patient: Hailey Daniels  Procedure(s) Performed: LEFT BREAST LUMPECTOMY WITH RADIOACTIVE SEED LOCALIZATION (Left Breast)     Patient location during evaluation: PACU Anesthesia Type: General Level of consciousness: awake and alert and oriented Pain management: pain level controlled Vital Signs Assessment: post-procedure vital signs reviewed and stable Respiratory status: spontaneous breathing, nonlabored ventilation and respiratory function stable Cardiovascular status: blood pressure returned to baseline and stable Postop Assessment: no apparent nausea or vomiting Anesthetic complications: no   No complications documented.  Last Vitals:  Vitals:   03/13/20 1215 03/13/20 1230  BP: (!) 147/85 (!) 160/85  Pulse: 69 62  Resp: 19 16  Temp:    SpO2: 100% 100%    Last Pain:  Vitals:   03/13/20 1230  TempSrc:   PainSc: 0-No pain                 Taneika Choi A.

## 2020-03-13 NOTE — H&P (Signed)
74 yof who has prior benign left breast excisional biopsy, no family history, no mass or dc. underwent her routine screening mm that shows left uoq architectural distortion. There is 3 mm mass associated with a 1.5 cm distortion. no [place] correlate. ax negative. she has biopsy that shows grade I-II IDC with DCIS that is >95% er/pr pos , her 2 negative, Ki is 15%. she is here with her friend (a retired IR PA) to discuss options. she is retired, married and lives in [place]   Past Surgical History Breast Biopsy  Left.  Diagnostic Studies History  Colonoscopy  1-5 years ago Mammogram  within last year Pap Smear  1-5 years ago  Medication History  Medications Reconciled  Social History  Alcohol use  Moderate alcohol use. Caffeine use  Carbonated beverages, Coffee, Tea. No drug use  Tobacco use  Never smoker.  Family History  Cancer  Father.  Pregnancy / Birth History  Age at menarche  24 years. Age of menopause  >61 Contraceptive History  Intrauterine device. Gravida  3 Length (months) of breastfeeding  7-12 Maternal age  3-25 Para  3 Regular periods   Other Problems  Cancer  Gastroesophageal Reflux Disease  High blood pressure  Hypercholesterolemia  Lump In Breast    Review of Systems  General Not Present- Appetite Loss, Chills, Fatigue, Fever, Night Sweats, Weight Gain and Weight Loss. Skin Not Present- Change in Wart/Mole, Dryness, Hives, Jaundice, New Lesions, Non-Healing Wounds, Rash and Ulcer. HEENT Present- Wears glasses/contact lenses. Not Present- Earache, Hearing Loss, Hoarseness, Nose Bleed, Oral Ulcers, Ringing in the Ears, Seasonal Allergies, Sinus Pain, Sore Throat, Visual Disturbances and Yellow Eyes. Respiratory Not Present- Bloody sputum, Chronic Cough, Difficulty Breathing, Snoring and Wheezing. Breast Not Present- Breast Mass, Breast Pain, Nipple Discharge and Skin Changes. Cardiovascular Not Present- Chest Pain, Difficulty  Breathing Lying Down, Leg Cramps, Palpitations, Rapid Heart Rate, Shortness of Breath and Swelling of Extremities. Gastrointestinal Not Present- Abdominal Pain, Bloating, Bloody Stool, Change in Bowel Habits, Chronic diarrhea, Constipation, Difficulty Swallowing, Excessive gas, Gets full quickly at meals, Hemorrhoids, Indigestion, Nausea, Rectal Pain and Vomiting. Female Genitourinary Not Present- Frequency, Nocturia, Painful Urination, Pelvic Pain and Urgency. Musculoskeletal Not Present- Back Pain, Joint Pain, Joint Stiffness, Muscle Pain, Muscle Weakness and Swelling of Extremities. Neurological Not Present- Decreased Memory, Fainting, Headaches, Numbness, Seizures, Tingling, Tremor, Trouble walking and Weakness. Psychiatric Not Present- Anxiety, Bipolar, Change in Sleep Pattern, Depression, Fearful and Frequent crying. Endocrine Not Present- Cold Intolerance, Excessive Hunger, Hair Changes, Heat Intolerance, Hot flashes and New Diabetes. Hematology Not Present- Blood Thinners, Easy Bruising, Excessive bleeding, Gland problems, HIV and Persistent Infections.   Physical Exam  General Mental Status-Alert. Orientation-Oriented X3.  Breast Nipples-No Discharge. Breast Lump-No Palpable Breast Mass.  Lymphatic Head & Neck  General Head & Neck Lymphatics: Bilateral - Description - Normal. Axillary  General Axillary Region: Bilateral - Description - Normal. Note: no [place] adenopathy   Assessment & Plan  BREAST CANCER OF UPPER-OUTER QUADRANT OF LEFT FEMALE BREAST (C50.412) Story: Left breast seed guided lumpectomy we discussed staging and pathophysiology of breast cancer and all available treatment options. she is over 71 with a 3 mm mass and a larger distortion with a biologically not aggressive cancer. We discussed surgery as the first step I think we can omit a sn biopsy with negative exam/imaging and this cancer. this will not affect her survival and avoid the morbidity in this  active women she is agreeable with that as as the oncologists. I  discussed lumpectomy vs mastectomy in detail There is no advantage to mastectomy and she does not want to pursue that either. will plan for seed guided lumpectomy. discussed risk of pos margin requiring a second surgery, 5-10%. risks, recovery discussed. remainder of adjuvant treatments will be decided on after surgery I will plan to work on scheduling her in next few weeks

## 2020-03-13 NOTE — Transfer of Care (Signed)
Immediate Anesthesia Transfer of Care Note  Patient: Hailey Daniels  Procedure(s) Performed: LEFT BREAST LUMPECTOMY WITH RADIOACTIVE SEED LOCALIZATION (Left Breast)  Patient Location: PACU  Anesthesia Type:General  Level of Consciousness: drowsy  Airway & Oxygen Therapy: Patient Spontanous Breathing and Patient connected to face mask oxygen  Post-op Assessment: Report given to RN and Post -op Vital signs reviewed and stable  Post vital signs: Reviewed and stable  Last Vitals:  Vitals Value Taken Time  BP 141/74 03/13/20 1210  Temp    Pulse 63 03/13/20 1211  Resp 17 03/13/20 1211  SpO2 100 % 03/13/20 1211  Vitals shown include unvalidated device data.  Last Pain:  Vitals:   03/13/20 1016  TempSrc: Oral  PainSc: 0-No pain      Patients Stated Pain Goal: 3 (21/19/41 7408)  Complications: No complications documented.

## 2020-03-14 ENCOUNTER — Encounter (HOSPITAL_BASED_OUTPATIENT_CLINIC_OR_DEPARTMENT_OTHER): Payer: Self-pay | Admitting: General Surgery

## 2020-03-15 LAB — SURGICAL PATHOLOGY

## 2020-03-18 ENCOUNTER — Encounter: Payer: Self-pay | Admitting: *Deleted

## 2020-03-20 NOTE — Progress Notes (Signed)
Patient Care Team: Tisovec, Fransico Him, MD as PCP - General (Internal Medicine) Fanny Skates, MD (General Surgery) Ladell Pier, MD as Attending Physician (Hematology and Oncology) Lafayette Dragon, MD (Inactive) (Gastroenterology) Rockwell Germany, RN as Oncology Nurse Navigator Mauro Kaufmann, RN as Oncology Nurse Navigator Rolm Bookbinder, MD as Consulting Physician (General Surgery) Nicholas Lose, MD as Consulting Physician (Hematology and Oncology) Gery Pray, MD as Consulting Physician (Radiation Oncology)  DIAGNOSIS:    ICD-10-CM   1. Malignant neoplasm of upper-outer quadrant of left breast in female, estrogen receptor positive ([place])  C50.412    Z17.0     SUMMARY OF ONCOLOGIC HISTORY: Oncology History  Malignant neoplasm of upper-outer quadrant of left breast in female, estrogen receptor positive ([place])  02/01/2020 Initial Diagnosis   Screening mammogram detected an architectural distortion in the left breast, 1.8cm at the 2 o'clock position. Biopsy showed invasive and in situ mammary carcinoma, grade 1-2, HER-2 equivocal by IHC, negative by FISH (ratio 1.28), ER/PR+ >95%, Ki67 5%.   02/07/2020 Cancer Staging   Staging form: Breast, AJCC 8th Edition - Clinical stage from 02/07/2020: Stage IA (cT1a, cN0, cM0, G2, ER+, PR+, HER2-) - Signed by Nicholas Lose, MD on 02/07/2020   03/13/2020 Surgery   Left lumpectomy Hailey Daniels): invasive and in situ ductal carcinoma, grade 1, 0.4cm, clear margins     CHIEF COMPLIANT: Follow-up s/p left lumpectomy   INTERVAL HISTORY: Hailey Daniels is a 74 y.o. with above-mentioned history of left breast cancer. She underwent a left lumpectomy on 03/13/20 with Dr. Donne Daniels for which pathology showed invasive and in situ ductal carcinoma, grade 1, 0.4cm, clear margins. She presents to the clinic today to review the pathology report and discuss further treatment.   ALLERGIES:  has No Known Allergies.  MEDICATIONS:  Current  Outpatient Medications  Medication Sig Dispense Refill  . atenolol (TENORMIN) 25 MG tablet Take 25 mg by mouth daily.    Marland Kitchen atorvastatin (LIPITOR) 20 MG tablet Take 1 tablet (20 mg total) by mouth daily. 30 tablet 6  . Cholecalciferol (VITAMIN D-3) 1000 units CAPS Take 2,000 Units by mouth daily.    . famotidine (PEPCID) 40 MG tablet TAKE ONE TABLET DAILY 90 tablet 3  . hydrochlorothiazide (HYDRODIURIL) 25 MG tablet Take 25 mg by mouth daily.    . Multiple Vitamin (MULTIVITAMIN WITH MINERALS) TABS tablet Take 1 tablet by mouth daily.    . Omega-3 Fatty Acids (FISH OIL) 1200 MG CPDR Take by mouth daily at 6 (six) AM.    . polycarbophil (FIBERCON) 625 MG tablet Take 625 mg by mouth daily. Take 2 pills daily    . potassium chloride (K-DUR) 10 MEQ tablet Take 10 mEq by mouth daily.    . timolol (TIMOPTIC-XR) 0.5 % ophthalmic gel-forming Apply 1 drop to eye Daily. Both eyes     Current Facility-Administered Medications  Medication Dose Route Frequency Provider Last Rate Last Admin  . 0.9 %  sodium chloride infusion  500 mL Intravenous Once Nandigam, Kavitha V, MD        PHYSICAL EXAMINATION: ECOG PERFORMANCE STATUS: 1 - Symptomatic but completely ambulatory  Vitals:   03/21/20 1145  BP: (!) 185/92  Pulse: 71  Resp: 17  Temp: 97.9 F (36.6 C)  SpO2: 99%   Filed Weights   03/21/20 1145  Weight: 143 lb 9.6 oz (65.1 kg)    LABORATORY DATA:  I have reviewed the data as listed CMP Latest Ref Rng & Units 03/07/2020 02/07/2020 06/27/2013  Glucose 70 - 99 mg/dL 85 97 -  BUN 8 - 23 mg/dL 10 14 -  Creatinine 0.44 - 1.00 mg/dL 0.60 0.65 -  Sodium 135 - 145 mmol/L 138 143 -  Potassium 3.5 - 5.1 mmol/L 4.3 3.5 -  Chloride 98 - 111 mmol/L 100 102 -  CO2 22 - 32 mmol/L 27 31 -  Calcium 8.9 - 10.3 mg/dL 9.6 10.1 -  Total Protein 6.5 - 8.1 g/dL - 7.4 7.2  Total Bilirubin 0.3 - 1.2 mg/dL - 0.8 0.8  Alkaline Phos 38 - 126 U/L - 60 51  AST 15 - 41 U/L - 18 21  ALT 0 - 44 U/L - 36 39(H)    Lab  Results  Component Value Date   WBC 5.1 02/07/2020   HGB 15.5 (H) 02/07/2020   HCT 45.0 02/07/2020   MCV 99.8 02/07/2020   PLT 216 02/07/2020   NEUTROABS 2.7 02/07/2020    ASSESSMENT & PLAN:  Malignant neoplasm of upper-outer quadrant of left breast in female, estrogen receptor positive ([place]) 03/13/2020:Left lumpectomy Hailey Daniels): invasive and in situ ductal carcinoma, grade 1, 0.4cm, clear margins ER/PR greater than 95%, Ki-67 5%, HER-2 negative by FISH ratio 1.28 T1 a N0 stage Ia  Pathology counseling: I discussed the final pathology report of the patient provided  a copy of this report. I discussed the margins as well as lymph node surgeries. We also discussed the final staging along with previously performed ER/PR and HER-2/neu testing.   Treatment plan: 1.  Adjuvant radiation therapy 2. followed by adjuvant antiestrogen therapy  Return clinic after radiation is complete to start antiestrogen therapy    No orders of the defined types were placed in this encounter.  The patient has a good understanding of the overall plan. she agrees with it. she will call with any problems that may develop before the next visit here.  Total time spent: 20 mins including face to face time and time spent for planning, charting and coordination of care  Nicholas Lose, MD 03/21/2020  I, Cloyde Reams Dorshimer, am acting as scribe for Dr. Nicholas Lose.  I have reviewed the above documentation for accuracy and completeness, and I agree with the above.

## 2020-03-21 ENCOUNTER — Other Ambulatory Visit: Payer: Self-pay

## 2020-03-21 ENCOUNTER — Inpatient Hospital Stay: Payer: Medicare Other | Attending: Hematology and Oncology | Admitting: Hematology and Oncology

## 2020-03-21 DIAGNOSIS — C50412 Malignant neoplasm of upper-outer quadrant of left female breast: Secondary | ICD-10-CM | POA: Diagnosis not present

## 2020-03-21 DIAGNOSIS — Z170 Estrogen receptor positive status [ER+]: Secondary | ICD-10-CM | POA: Diagnosis not present

## 2020-03-21 DIAGNOSIS — Z79899 Other long term (current) drug therapy: Secondary | ICD-10-CM | POA: Diagnosis not present

## 2020-03-21 NOTE — Assessment & Plan Note (Signed)
03/13/2020:Left lumpectomy Hailey Daniels): invasive and in situ ductal carcinoma, grade 1, 0.4cm, clear margins ER/PR greater than 95%, Ki-67 5%, HER-2 negative by FISH ratio 1.28 T1 a N0 stage Ia  Pathology counseling: I discussed the final pathology report of the patient provided  a copy of this report. I discussed the margins as well as lymph node surgeries. We also discussed the final staging along with previously performed ER/PR and HER-2/neu testing.   Treatment plan: 1.  Adjuvant radiation therapy 2. followed by adjuvant antiestrogen therapy  Return clinic after radiation is complete to start antiestrogen therapy

## 2020-03-25 ENCOUNTER — Telehealth: Payer: Self-pay | Admitting: Hematology and Oncology

## 2020-03-25 NOTE — Telephone Encounter (Signed)
No 12/23 los, no changes made to pt schedule 

## 2020-04-04 ENCOUNTER — Ambulatory Visit: Payer: Medicare Other | Admitting: Oncology

## 2020-04-05 DIAGNOSIS — H6122 Impacted cerumen, left ear: Secondary | ICD-10-CM | POA: Diagnosis not present

## 2020-04-05 DIAGNOSIS — J310 Chronic rhinitis: Secondary | ICD-10-CM | POA: Diagnosis not present

## 2020-04-05 DIAGNOSIS — J343 Hypertrophy of nasal turbinates: Secondary | ICD-10-CM | POA: Diagnosis not present

## 2020-04-05 DIAGNOSIS — J342 Deviated nasal septum: Secondary | ICD-10-CM | POA: Diagnosis not present

## 2020-04-10 ENCOUNTER — Ambulatory Visit
Admission: RE | Admit: 2020-04-10 | Discharge: 2020-04-10 | Disposition: A | Discharge status: Home / Self Care | Payer: Medicare Other | Source: Ambulatory Visit | Attending: Radiation Oncology | Admitting: Radiation Oncology

## 2020-04-10 ENCOUNTER — Encounter: Payer: Self-pay | Admitting: Radiation Oncology

## 2020-04-10 ENCOUNTER — Other Ambulatory Visit: Payer: Self-pay

## 2020-04-10 VITALS — BP 182/107 | HR 67 | Temp 97.0°F | Resp 18 | Ht 69.0 in | Wt 145.1 lb

## 2020-04-10 DIAGNOSIS — C50412 Malignant neoplasm of upper-outer quadrant of left female breast: Secondary | ICD-10-CM

## 2020-04-10 DIAGNOSIS — Z170 Estrogen receptor positive status [ER+]: Secondary | ICD-10-CM | POA: Insufficient documentation

## 2020-04-10 DIAGNOSIS — Z79899 Other long term (current) drug therapy: Secondary | ICD-10-CM | POA: Insufficient documentation

## 2020-04-10 DIAGNOSIS — Z510 Encounter for antineoplastic radiation therapy: Secondary | ICD-10-CM | POA: Diagnosis not present

## 2020-04-10 NOTE — Progress Notes (Signed)
Radiation Oncology         [phone] ________________________________  Name: Hailey Daniels MRN: [id number]  Date: 04/10/2020  DOB: [birth year]  Re-Evaluation Note  CC: Tisovec, Fransico Him, MD  Nicholas Lose, MD    ICD-10-CM   1. Malignant neoplasm of upper-outer quadrant of left breast in female, estrogen receptor positive ([place])  C50.412    Z17.0     Diagnosis: Stage IA (pT1a, cN0, cM0) Left Breast UOQ, Invasive Ductal Carcinoma with DCIS, ER+ / PR+ / Her2-, Grade 1  Narrative:  The patient returns today to discuss radiation treatment options. She was seen in the multidisciplinary breast clinic on 02/07/2020, during which time it was recommended that she proceed with left lumpectomy (senitnel node procedure was not planned in light of her excellent prognosis) adjuvant radiation therapy depending on the final pathologic findings and patient decision, and aromatase inhibitor.  She underwent a left breast lumpectomy on 03/13/2020 under the care of Dr. Donne Hazel. Pathology from the procedure revealed grade 1 invasive ductal carcinoma with low-grade ductal carcinoma in-situ. Calcifications were associated with carcinoma and benign breast tissue. Margins were uninvolved by carcinoma; closest being 0.5 cm from the medial margin.   On review of systems, the patient reports doing well with her surgery. She denies breast pain or lymphedema and any other symptoms.    Allergies:  has No Known Allergies.  Meds: Current Outpatient Medications  Medication Sig Dispense Refill  . atenolol (TENORMIN) 25 MG tablet Take 25 mg by mouth daily.    Marland Kitchen atorvastatin (LIPITOR) 20 MG tablet Take 1 tablet (20 mg total) by mouth daily. 30 tablet 6  . Cholecalciferol (VITAMIN D-3) 1000 units CAPS Take 2,000 Units by mouth daily.    . famotidine (PEPCID) 40 MG tablet TAKE ONE TABLET DAILY 90 tablet 3  . hydrochlorothiazide (HYDRODIURIL) 25 MG tablet Take 25 mg by mouth daily.    . Multiple Vitamin  (MULTIVITAMIN WITH MINERALS) TABS tablet Take 1 tablet by mouth daily.    . Omega-3 Fatty Acids (FISH OIL) 1200 MG CPDR Take by mouth daily at 6 (six) AM.    . polycarbophil (FIBERCON) 625 MG tablet Take 625 mg by mouth daily. Take 2 pills daily    . potassium chloride (K-DUR) 10 MEQ tablet Take 10 mEq by mouth daily.    . timolol (TIMOPTIC-XR) 0.5 % ophthalmic gel-forming Apply 1 drop to eye Daily. Both eyes     Current Facility-Administered Medications  Medication Dose Route Frequency Provider Last Rate Last Admin  . 0.9 %  sodium chloride infusion  500 mL Intravenous Once Nandigam, Venia Minks, MD        Physical Findings: The patient is in no acute distress. Patient is alert and oriented.  height is _0  (1.753 m) and weight is 145 lb 2 oz (65.8 kg). Her temporal temperature is 97 F (36.1 C) (abnormal). Her blood pressure is 182/107 (abnormal) and her pulse is 67. Her respiration is 18 and oxygen saturation is 100%.  No significant changes. Lungs are clear to auscultation bilaterally. Heart has regular rate and rhythm. No palpable cervical, supraclavicular, or axillary adenopathy. Abdomen soft, non-tender, normal bowel sounds. Right breast: no palpable mass, nipple discharge or bleeding. Left breast: Well-healed scar in the upper outer quadrant.  No palpable mass nipple discharge or bleeding  Lab Findings: Lab Results  Component Value Date   WBC 5.1 02/07/2020   HGB 15.5 (H) 02/07/2020   HCT 45.0 02/07/2020   MCV 99.8 02/07/2020  PLT 216 02/07/2020    Radiographic Findings: No results found.  Impression: Stage IA (pT1a, cN0, cM0) Left Breast UOQ, Invasive Ductal Carcinoma with DCIS, ER+ / PR+ / Her2-, Grade 1  I discussed with the patient that her prognosis is excellent, given her small tumor ER PR positivity, low proliferation marker.  1 option for her would be to consider surgery alone followed by adjuvant hormonal therapy.  I discussed with the patient that we she would have  a slightly higher risk for local recurrence in the left breast but no overall impact on her survival.  We also discussed the general course of radiation therapy side effects and potential toxicities of radiation therapy in her situation.  Patient would appear to be a good candidate for hypofractionated accelerated radiation therapy over approximately 3-1/2 to 4 weeks.  After careful consideration and discussing with her husband the patient would like to proceed with adjuvant radiation therapy as well as adjuvant hormonal therapy as part of her overall management.  Plan:  Patient is scheduled for CT simulation today.  She will begin radiation therapy in approximately 1 week.  Anticipate an hypofractionated  accelerated course of treatment.  We will use cardiac sparing techniques if necessary.  Total time spent in this encounter was 35 minutes which included reviewing the patient's most recent lumpectomy, pathology report, follow-ups, physical examination, and documentation.  -----------------------------------  Blair Promise, PhD, MD  This document serves as a record of services personally performed by Gery Pray, MD. It was created on his behalf by Clerance Lav, a trained medical scribe. The creation of this record is based on the scribe's personal observations and the provider's statements to them. This document has been checked and approved by the attending provider.

## 2020-04-10 NOTE — Progress Notes (Signed)
Location of Breast Cancer:   Histology per Pathology Report:  Receptor Status: ER(), PR (), Her2-neu ), Ki-()  Did patient present with symptoms (if so, please note symptoms) or was this found on screening mammography?:Mammogram  Past/Anticipated interventions by surgeon, if   Past/Anticipated interventions by medical oncology, if any: Chemotherapy None  Lymphedema issues, if any:  None  Pain issues, if any:  None  SAFETY ISSUES:  Prior radiation? Noe  Pacemaker/ICD? NOne  Possible current pregnancy?None  Is the patient on methotrexate? None  Current Complaints / other details:    Patients blood pressure was elevated denies any s/s of hypertension Sherrlyn Hock, RN 04/10/2020,10:45 AM  Vitals:   04/10/20 1037  BP: (!) 182/107  Pulse: 67  Resp: 18  Temp: (!) 97 F (36.1 C)  TempSrc: Temporal  SpO2: 100%  Weight: 65.8 kg  Height: _0  (1.753 m)

## 2020-04-11 ENCOUNTER — Ambulatory Visit: Payer: Medicare Other | Admitting: Radiation Oncology

## 2020-04-15 ENCOUNTER — Telehealth: Payer: Self-pay | Admitting: Hematology and Oncology

## 2020-04-15 ENCOUNTER — Encounter: Payer: Self-pay | Admitting: *Deleted

## 2020-04-15 DIAGNOSIS — Z170 Estrogen receptor positive status [ER+]: Secondary | ICD-10-CM | POA: Diagnosis not present

## 2020-04-15 DIAGNOSIS — C50412 Malignant neoplasm of upper-outer quadrant of left female breast: Secondary | ICD-10-CM | POA: Diagnosis not present

## 2020-04-15 DIAGNOSIS — Z510 Encounter for antineoplastic radiation therapy: Secondary | ICD-10-CM | POA: Diagnosis not present

## 2020-04-15 NOTE — Telephone Encounter (Signed)
Scheduled appt per 1/17 schmsg - mailed reminder letter with appt date and time

## 2020-04-17 ENCOUNTER — Ambulatory Visit
Admission: RE | Admit: 2020-04-17 | Discharge: 2020-04-17 | Disposition: A | Discharge status: Home / Self Care | Payer: Medicare Other | Source: Ambulatory Visit | Attending: Radiation Oncology | Admitting: Radiation Oncology

## 2020-04-17 ENCOUNTER — Other Ambulatory Visit: Payer: Self-pay

## 2020-04-17 DIAGNOSIS — Z170 Estrogen receptor positive status [ER+]: Secondary | ICD-10-CM | POA: Diagnosis not present

## 2020-04-17 DIAGNOSIS — C50412 Malignant neoplasm of upper-outer quadrant of left female breast: Secondary | ICD-10-CM | POA: Diagnosis not present

## 2020-04-17 DIAGNOSIS — Z510 Encounter for antineoplastic radiation therapy: Secondary | ICD-10-CM | POA: Diagnosis not present

## 2020-04-18 ENCOUNTER — Ambulatory Visit
Admission: RE | Admit: 2020-04-18 | Discharge: 2020-04-18 | Disposition: A | Discharge status: Home / Self Care | Payer: Medicare Other | Source: Ambulatory Visit | Attending: Radiation Oncology | Admitting: Radiation Oncology

## 2020-04-18 DIAGNOSIS — Z510 Encounter for antineoplastic radiation therapy: Secondary | ICD-10-CM | POA: Diagnosis not present

## 2020-04-18 DIAGNOSIS — Z170 Estrogen receptor positive status [ER+]: Secondary | ICD-10-CM | POA: Diagnosis not present

## 2020-04-18 DIAGNOSIS — C50412 Malignant neoplasm of upper-outer quadrant of left female breast: Secondary | ICD-10-CM | POA: Diagnosis not present

## 2020-04-19 ENCOUNTER — Ambulatory Visit
Admission: RE | Admit: 2020-04-19 | Discharge: 2020-04-19 | Disposition: A | Discharge status: Home / Self Care | Payer: Medicare Other | Source: Ambulatory Visit | Attending: Radiation Oncology | Admitting: Radiation Oncology

## 2020-04-19 DIAGNOSIS — C50412 Malignant neoplasm of upper-outer quadrant of left female breast: Secondary | ICD-10-CM | POA: Diagnosis not present

## 2020-04-19 DIAGNOSIS — Z510 Encounter for antineoplastic radiation therapy: Secondary | ICD-10-CM | POA: Diagnosis not present

## 2020-04-19 DIAGNOSIS — Z170 Estrogen receptor positive status [ER+]: Secondary | ICD-10-CM | POA: Diagnosis not present

## 2020-04-22 ENCOUNTER — Other Ambulatory Visit: Payer: Self-pay

## 2020-04-22 ENCOUNTER — Ambulatory Visit
Admission: RE | Admit: 2020-04-22 | Discharge: 2020-04-22 | Disposition: A | Discharge status: Home / Self Care | Payer: Medicare Other | Source: Ambulatory Visit | Attending: Radiation Oncology | Admitting: Radiation Oncology

## 2020-04-22 DIAGNOSIS — R7301 Impaired fasting glucose: Secondary | ICD-10-CM | POA: Diagnosis not present

## 2020-04-22 DIAGNOSIS — Z510 Encounter for antineoplastic radiation therapy: Secondary | ICD-10-CM | POA: Diagnosis not present

## 2020-04-22 DIAGNOSIS — E7800 Pure hypercholesterolemia, unspecified: Secondary | ICD-10-CM | POA: Diagnosis not present

## 2020-04-22 DIAGNOSIS — C50412 Malignant neoplasm of upper-outer quadrant of left female breast: Secondary | ICD-10-CM | POA: Diagnosis not present

## 2020-04-22 DIAGNOSIS — Z170 Estrogen receptor positive status [ER+]: Secondary | ICD-10-CM | POA: Diagnosis not present

## 2020-04-23 ENCOUNTER — Ambulatory Visit
Admission: RE | Admit: 2020-04-23 | Discharge: 2020-04-23 | Disposition: A | Discharge status: Home / Self Care | Payer: Medicare Other | Source: Ambulatory Visit | Attending: Radiation Oncology | Admitting: Radiation Oncology

## 2020-04-23 ENCOUNTER — Other Ambulatory Visit: Payer: Self-pay

## 2020-04-23 DIAGNOSIS — C50412 Malignant neoplasm of upper-outer quadrant of left female breast: Secondary | ICD-10-CM | POA: Diagnosis not present

## 2020-04-23 DIAGNOSIS — Z510 Encounter for antineoplastic radiation therapy: Secondary | ICD-10-CM | POA: Diagnosis not present

## 2020-04-23 DIAGNOSIS — Z170 Estrogen receptor positive status [ER+]: Secondary | ICD-10-CM | POA: Diagnosis not present

## 2020-04-23 MED ORDER — RADIAPLEXRX EX GEL: Freq: Once | CUTANEOUS | Status: AC

## 2020-04-23 MED ORDER — ALRA NON-METALLIC DEODORANT (RAD-ONC)
1.0000 "application " | Freq: Once | TOPICAL | Status: AC
  Administered 2020-04-23: 1 via TOPICAL

## 2020-04-24 ENCOUNTER — Ambulatory Visit
Admission: RE | Admit: 2020-04-24 | Discharge: 2020-04-24 | Disposition: A | Discharge status: Home / Self Care | Payer: Medicare Other | Source: Ambulatory Visit | Attending: Radiation Oncology | Admitting: Radiation Oncology

## 2020-04-24 ENCOUNTER — Other Ambulatory Visit: Payer: Self-pay

## 2020-04-24 DIAGNOSIS — Z85828 Personal history of other malignant neoplasm of skin: Secondary | ICD-10-CM | POA: Diagnosis not present

## 2020-04-24 DIAGNOSIS — Z510 Encounter for antineoplastic radiation therapy: Secondary | ICD-10-CM | POA: Diagnosis not present

## 2020-04-24 DIAGNOSIS — L570 Actinic keratosis: Secondary | ICD-10-CM | POA: Diagnosis not present

## 2020-04-24 DIAGNOSIS — L821 Other seborrheic keratosis: Secondary | ICD-10-CM | POA: Diagnosis not present

## 2020-04-24 DIAGNOSIS — D1801 Hemangioma of skin and subcutaneous tissue: Secondary | ICD-10-CM | POA: Diagnosis not present

## 2020-04-24 DIAGNOSIS — Z170 Estrogen receptor positive status [ER+]: Secondary | ICD-10-CM | POA: Diagnosis not present

## 2020-04-24 DIAGNOSIS — D1722 Benign lipomatous neoplasm of skin and subcutaneous tissue of left arm: Secondary | ICD-10-CM | POA: Diagnosis not present

## 2020-04-24 DIAGNOSIS — C50412 Malignant neoplasm of upper-outer quadrant of left female breast: Secondary | ICD-10-CM | POA: Diagnosis not present

## 2020-04-24 DIAGNOSIS — D692 Other nonthrombocytopenic purpura: Secondary | ICD-10-CM | POA: Diagnosis not present

## 2020-04-25 ENCOUNTER — Other Ambulatory Visit: Payer: Self-pay

## 2020-04-25 ENCOUNTER — Ambulatory Visit
Admission: RE | Admit: 2020-04-25 | Discharge: 2020-04-25 | Disposition: A | Discharge status: Home / Self Care | Payer: Medicare Other | Source: Ambulatory Visit | Attending: Radiation Oncology | Admitting: Radiation Oncology

## 2020-04-25 DIAGNOSIS — Z170 Estrogen receptor positive status [ER+]: Secondary | ICD-10-CM | POA: Diagnosis not present

## 2020-04-25 DIAGNOSIS — C50412 Malignant neoplasm of upper-outer quadrant of left female breast: Secondary | ICD-10-CM | POA: Diagnosis not present

## 2020-04-25 DIAGNOSIS — Z510 Encounter for antineoplastic radiation therapy: Secondary | ICD-10-CM | POA: Diagnosis not present

## 2020-04-26 ENCOUNTER — Other Ambulatory Visit: Payer: Self-pay

## 2020-04-26 ENCOUNTER — Ambulatory Visit
Admission: RE | Admit: 2020-04-26 | Discharge: 2020-04-26 | Disposition: A | Discharge status: Home / Self Care | Payer: Medicare Other | Source: Ambulatory Visit | Attending: Radiation Oncology | Admitting: Radiation Oncology

## 2020-04-26 DIAGNOSIS — Z510 Encounter for antineoplastic radiation therapy: Secondary | ICD-10-CM | POA: Diagnosis not present

## 2020-04-26 DIAGNOSIS — Z170 Estrogen receptor positive status [ER+]: Secondary | ICD-10-CM | POA: Diagnosis not present

## 2020-04-26 DIAGNOSIS — C50412 Malignant neoplasm of upper-outer quadrant of left female breast: Secondary | ICD-10-CM | POA: Diagnosis not present

## 2020-04-29 ENCOUNTER — Other Ambulatory Visit: Payer: Self-pay

## 2020-04-29 ENCOUNTER — Ambulatory Visit
Admission: RE | Admit: 2020-04-29 | Discharge: 2020-04-29 | Disposition: A | Discharge status: Home / Self Care | Payer: Medicare Other | Source: Ambulatory Visit | Attending: Radiation Oncology | Admitting: Radiation Oncology

## 2020-04-29 DIAGNOSIS — R8281 Pyuria: Secondary | ICD-10-CM | POA: Diagnosis not present

## 2020-04-29 DIAGNOSIS — Z170 Estrogen receptor positive status [ER+]: Secondary | ICD-10-CM | POA: Diagnosis not present

## 2020-04-29 DIAGNOSIS — K208 Other esophagitis without bleeding: Secondary | ICD-10-CM | POA: Diagnosis not present

## 2020-04-29 DIAGNOSIS — Z510 Encounter for antineoplastic radiation therapy: Secondary | ICD-10-CM | POA: Diagnosis not present

## 2020-04-29 DIAGNOSIS — I447 Left bundle-branch block, unspecified: Secondary | ICD-10-CM | POA: Diagnosis not present

## 2020-04-29 DIAGNOSIS — F321 Major depressive disorder, single episode, moderate: Secondary | ICD-10-CM | POA: Diagnosis not present

## 2020-04-29 DIAGNOSIS — Z0000 Encounter for general adult medical examination without abnormal findings: Secondary | ICD-10-CM | POA: Diagnosis not present

## 2020-04-29 DIAGNOSIS — C49A2 Gastrointestinal stromal tumor of stomach: Secondary | ICD-10-CM | POA: Diagnosis not present

## 2020-04-29 DIAGNOSIS — I10 Essential (primary) hypertension: Secondary | ICD-10-CM | POA: Diagnosis not present

## 2020-04-29 DIAGNOSIS — Z79890 Hormone replacement therapy (postmenopausal): Secondary | ICD-10-CM | POA: Diagnosis not present

## 2020-04-29 DIAGNOSIS — E7800 Pure hypercholesterolemia, unspecified: Secondary | ICD-10-CM | POA: Diagnosis not present

## 2020-04-29 DIAGNOSIS — R7301 Impaired fasting glucose: Secondary | ICD-10-CM | POA: Diagnosis not present

## 2020-04-29 DIAGNOSIS — C50412 Malignant neoplasm of upper-outer quadrant of left female breast: Secondary | ICD-10-CM | POA: Diagnosis not present

## 2020-04-30 ENCOUNTER — Ambulatory Visit
Admission: RE | Admit: 2020-04-30 | Discharge: 2020-04-30 | Disposition: A | Discharge status: Home / Self Care | Payer: Medicare Other | Source: Ambulatory Visit | Attending: Radiation Oncology | Admitting: Radiation Oncology

## 2020-04-30 ENCOUNTER — Ambulatory Visit: Payer: Medicare Other | Admitting: Radiation Oncology

## 2020-04-30 ENCOUNTER — Other Ambulatory Visit: Payer: Self-pay

## 2020-04-30 DIAGNOSIS — C50412 Malignant neoplasm of upper-outer quadrant of left female breast: Secondary | ICD-10-CM | POA: Diagnosis not present

## 2020-04-30 DIAGNOSIS — Z170 Estrogen receptor positive status [ER+]: Secondary | ICD-10-CM | POA: Insufficient documentation

## 2020-04-30 DIAGNOSIS — C50919 Malignant neoplasm of unspecified site of unspecified female breast: Secondary | ICD-10-CM

## 2020-04-30 HISTORY — DX: Malignant neoplasm of unspecified site of unspecified female breast: C50.919

## 2020-05-01 ENCOUNTER — Ambulatory Visit
Admission: RE | Admit: 2020-05-01 | Discharge: 2020-05-01 | Disposition: A | Discharge status: Home / Self Care | Payer: Medicare Other | Source: Ambulatory Visit | Attending: Radiation Oncology | Admitting: Radiation Oncology

## 2020-05-01 DIAGNOSIS — C50412 Malignant neoplasm of upper-outer quadrant of left female breast: Secondary | ICD-10-CM | POA: Diagnosis not present

## 2020-05-01 DIAGNOSIS — Z1212 Encounter for screening for malignant neoplasm of rectum: Secondary | ICD-10-CM | POA: Diagnosis not present

## 2020-05-01 DIAGNOSIS — Z170 Estrogen receptor positive status [ER+]: Secondary | ICD-10-CM | POA: Diagnosis not present

## 2020-05-02 ENCOUNTER — Other Ambulatory Visit: Payer: Self-pay

## 2020-05-02 ENCOUNTER — Ambulatory Visit
Admission: RE | Admit: 2020-05-02 | Discharge: 2020-05-02 | Disposition: A | Discharge status: Home / Self Care | Payer: Medicare Other | Source: Ambulatory Visit | Attending: Radiation Oncology | Admitting: Radiation Oncology

## 2020-05-02 DIAGNOSIS — C50412 Malignant neoplasm of upper-outer quadrant of left female breast: Secondary | ICD-10-CM | POA: Diagnosis not present

## 2020-05-02 DIAGNOSIS — Z170 Estrogen receptor positive status [ER+]: Secondary | ICD-10-CM | POA: Diagnosis not present

## 2020-05-03 ENCOUNTER — Other Ambulatory Visit: Payer: Self-pay

## 2020-05-03 ENCOUNTER — Ambulatory Visit
Admission: RE | Admit: 2020-05-03 | Discharge: 2020-05-03 | Disposition: A | Discharge status: Home / Self Care | Payer: Medicare Other | Source: Ambulatory Visit | Attending: Radiation Oncology | Admitting: Radiation Oncology

## 2020-05-03 ENCOUNTER — Other Ambulatory Visit: Payer: Self-pay | Admitting: Gastroenterology

## 2020-05-03 DIAGNOSIS — C50412 Malignant neoplasm of upper-outer quadrant of left female breast: Secondary | ICD-10-CM | POA: Diagnosis not present

## 2020-05-03 DIAGNOSIS — Z170 Estrogen receptor positive status [ER+]: Secondary | ICD-10-CM | POA: Diagnosis not present

## 2020-05-06 ENCOUNTER — Ambulatory Visit
Admission: RE | Admit: 2020-05-06 | Discharge: 2020-05-06 | Disposition: A | Discharge status: Home / Self Care | Payer: Medicare Other | Source: Ambulatory Visit | Attending: Radiation Oncology | Admitting: Radiation Oncology

## 2020-05-06 ENCOUNTER — Other Ambulatory Visit: Payer: Self-pay

## 2020-05-06 DIAGNOSIS — C50412 Malignant neoplasm of upper-outer quadrant of left female breast: Secondary | ICD-10-CM | POA: Diagnosis not present

## 2020-05-06 DIAGNOSIS — Z170 Estrogen receptor positive status [ER+]: Secondary | ICD-10-CM | POA: Diagnosis not present

## 2020-05-07 ENCOUNTER — Other Ambulatory Visit: Payer: Self-pay

## 2020-05-07 ENCOUNTER — Encounter: Payer: Self-pay | Admitting: *Deleted

## 2020-05-07 ENCOUNTER — Ambulatory Visit
Admission: RE | Admit: 2020-05-07 | Discharge: 2020-05-07 | Disposition: A | Discharge status: Home / Self Care | Payer: Medicare Other | Source: Ambulatory Visit | Attending: Radiation Oncology | Admitting: Radiation Oncology

## 2020-05-07 DIAGNOSIS — C50412 Malignant neoplasm of upper-outer quadrant of left female breast: Secondary | ICD-10-CM | POA: Diagnosis not present

## 2020-05-07 DIAGNOSIS — Z170 Estrogen receptor positive status [ER+]: Secondary | ICD-10-CM | POA: Diagnosis not present

## 2020-05-08 ENCOUNTER — Ambulatory Visit
Admission: RE | Admit: 2020-05-08 | Discharge: 2020-05-08 | Disposition: A | Discharge status: Home / Self Care | Payer: Medicare Other | Source: Ambulatory Visit | Attending: Radiation Oncology | Admitting: Radiation Oncology

## 2020-05-08 ENCOUNTER — Other Ambulatory Visit: Payer: Self-pay

## 2020-05-08 DIAGNOSIS — Z170 Estrogen receptor positive status [ER+]: Secondary | ICD-10-CM

## 2020-05-08 DIAGNOSIS — C50412 Malignant neoplasm of upper-outer quadrant of left female breast: Secondary | ICD-10-CM | POA: Diagnosis not present

## 2020-05-09 ENCOUNTER — Ambulatory Visit
Admission: RE | Admit: 2020-05-09 | Discharge: 2020-05-09 | Disposition: A | Discharge status: Home / Self Care | Payer: Medicare Other | Source: Ambulatory Visit | Attending: Radiation Oncology | Admitting: Radiation Oncology

## 2020-05-09 ENCOUNTER — Other Ambulatory Visit: Payer: Self-pay

## 2020-05-09 DIAGNOSIS — Z170 Estrogen receptor positive status [ER+]: Secondary | ICD-10-CM | POA: Diagnosis not present

## 2020-05-09 DIAGNOSIS — C50412 Malignant neoplasm of upper-outer quadrant of left female breast: Secondary | ICD-10-CM | POA: Diagnosis not present

## 2020-05-10 ENCOUNTER — Other Ambulatory Visit: Payer: Self-pay

## 2020-05-10 ENCOUNTER — Ambulatory Visit
Admission: RE | Admit: 2020-05-10 | Discharge: 2020-05-10 | Disposition: A | Discharge status: Home / Self Care | Payer: Medicare Other | Source: Ambulatory Visit | Attending: Radiation Oncology | Admitting: Radiation Oncology

## 2020-05-10 DIAGNOSIS — C50412 Malignant neoplasm of upper-outer quadrant of left female breast: Secondary | ICD-10-CM | POA: Diagnosis not present

## 2020-05-10 DIAGNOSIS — Z170 Estrogen receptor positive status [ER+]: Secondary | ICD-10-CM | POA: Diagnosis not present

## 2020-05-12 NOTE — Progress Notes (Signed)
Patient Care Team: Tisovec, Fransico Him, MD as PCP - General (Internal Medicine) Fanny Skates, MD (General Surgery) Ladell Pier, MD as Attending Physician (Hematology and Oncology) Lafayette Dragon, MD (Inactive) (Gastroenterology) Rockwell Germany, RN as Oncology Nurse Navigator Mauro Kaufmann, RN as Oncology Nurse Navigator Rolm Bookbinder, MD as Consulting Physician (General Surgery) Nicholas Lose, MD as Consulting Physician (Hematology and Oncology) Gery Pray, MD as Consulting Physician (Radiation Oncology)  DIAGNOSIS:    ICD-10-CM   1. Malignant neoplasm of upper-outer quadrant of left breast in female, estrogen receptor positive ([place])  C50.412    Z17.0     SUMMARY OF ONCOLOGIC HISTORY: Oncology History  Malignant neoplasm of upper-outer quadrant of left breast in female, estrogen receptor positive ([place])  02/01/2020 Initial Diagnosis   Screening mammogram detected an architectural distortion in the left breast, 1.8cm at the 2 o'clock position. Biopsy showed invasive and in situ mammary carcinoma, grade 1-2, HER-2 equivocal by IHC, negative by FISH (ratio 1.28), ER/PR+ >95%, Ki67 5%.   02/07/2020 Cancer Staging   Staging form: Breast, AJCC 8th Edition - Clinical stage from 02/07/2020: Stage IA (cT1a, cN0, cM0, G2, ER+, PR+, HER2-) - Signed by Nicholas Lose, MD on 02/07/2020   03/13/2020 Surgery   Left lumpectomy Donne Hazel): invasive and in situ ductal carcinoma, grade 1, 0.4cm, clear margins   04/18/2020 -  Radiation Therapy   Adjuvant radiation     CHIEF COMPLIANT: Follow-up to discuss antiestrogen therapy  INTERVAL HISTORY: Hailey Daniels is a 75 y.o. with above-mentioned history of left breast cancer who underwent a left lumpectomy and is currently undergoing radiation. She presents to the clinic today to discuss antiestrogen therapy. Radiation dermatitis. Tiredness. Last XRT today.  ALLERGIES:  has No Known Allergies.  MEDICATIONS:  Current  Outpatient Medications  Medication Sig Dispense Refill  . atenolol (TENORMIN) 25 MG tablet Take 25 mg by mouth daily.    Marland Kitchen atorvastatin (LIPITOR) 20 MG tablet Take 1 tablet (20 mg total) by mouth daily. 30 tablet 6  . Cholecalciferol (VITAMIN D-3) 1000 units CAPS Take 2,000 Units by mouth daily.    . famotidine (PEPCID) 40 MG tablet TAKE ONE TABLET DAILY 90 tablet 3  . hydrochlorothiazide (HYDRODIURIL) 25 MG tablet Take 25 mg by mouth daily.    . Multiple Vitamin (MULTIVITAMIN WITH MINERALS) TABS tablet Take 1 tablet by mouth daily.    . Omega-3 Fatty Acids (FISH OIL) 1200 MG CPDR Take by mouth daily at 6 (six) AM.    . polycarbophil (FIBERCON) 625 MG tablet Take 625 mg by mouth daily. Take 2 pills daily    . potassium chloride (K-DUR) 10 MEQ tablet Take 10 mEq by mouth daily.    . timolol (TIMOPTIC-XR) 0.5 % ophthalmic gel-forming Apply 1 drop to eye Daily. Both eyes     Current Facility-Administered Medications  Medication Dose Route Frequency Provider Last Rate Last Admin  . 0.9 %  sodium chloride infusion  500 mL Intravenous Once Nandigam, Kavitha V, MD        PHYSICAL EXAMINATION: ECOG PERFORMANCE STATUS: 1 - Symptomatic but completely ambulatory  There were no vitals filed for this visit. There were no vitals filed for this visit.  LABORATORY DATA:  I have reviewed the data as listed CMP Latest Ref Rng & Units 03/07/2020 02/07/2020 06/27/2013  Glucose 70 - 99 mg/dL 85 97 -  BUN 8 - 23 mg/dL 10 14 -  Creatinine 0.44 - 1.00 mg/dL 0.60 0.65 -  Sodium 135 -  145 mmol/L 138 143 -  Potassium 3.5 - 5.1 mmol/L 4.3 3.5 -  Chloride 98 - 111 mmol/L 100 102 -  CO2 22 - 32 mmol/L 27 31 -  Calcium 8.9 - 10.3 mg/dL 9.6 10.1 -  Total Protein 6.5 - 8.1 g/dL - 7.4 7.2  Total Bilirubin 0.3 - 1.2 mg/dL - 0.8 0.8  Alkaline Phos 38 - 126 U/L - 60 51  AST 15 - 41 U/L - 18 21  ALT 0 - 44 U/L - 36 39(H)    Lab Results  Component Value Date   WBC 5.1 02/07/2020   HGB 15.5 (H) 02/07/2020   HCT  45.0 02/07/2020   MCV 99.8 02/07/2020   PLT 216 02/07/2020   NEUTROABS 2.7 02/07/2020    ASSESSMENT & PLAN:  Malignant neoplasm of upper-outer quadrant of left breast in female, estrogen receptor positive ([place]) 03/13/2020:Left lumpectomy Donne Hazel): invasive and in situ ductal carcinoma, grade 1, 0.4cm, clear margins ER/PR greater than 95%, Ki-67 5%, HER-2 negative by FISH ratio 1.28 T1 a N0 stage Ia  Pathology counseling: I discussed the final pathology report of the patient provided  a copy of this report. I discussed the margins as well as lymph node surgeries. We also discussed the final staging along with previously performed ER/PR and HER-2/neu testing.  Treatment plan: 1.  Adjuvant radiation therapy 04/18/20- 05/14/20 2. followed by adjuvant antiestrogen therapy [facility] at [facility]  Anastrozole counseling: We discussed the risks and benefits of anti-estrogen therapy with aromatase inhibitors. These include but not limited to insomnia, hot flashes, mood changes, vaginal dryness, bone density loss, and weight gain. We strongly believe that the benefits far outweigh the risks. Patient understands these risks and consented to starting treatment. Planned treatment duration is 7 years.  RTC in 3 months for SCP visit      No orders of the defined types were placed in this encounter.  The patient has a good understanding of the overall plan. she agrees with it. she will call with any problems that may develop before the next visit here.  Total time spent: 20 mins including face to face time and time spent for planning, charting and coordination of care  Rulon Eisenmenger, MD, MPH 05/13/2020  I, Molly Dorshimer, am acting as scribe for Dr. Nicholas Lose.  I have reviewed the above documentation for accuracy and completeness, and I agree with the above.

## 2020-05-12 NOTE — Assessment & Plan Note (Signed)
03/13/2020:Left lumpectomy Hailey Daniels): invasive and in situ ductal carcinoma, grade 1, 0.4cm, clear margins ER/PR greater than 95%, Ki-67 5%, HER-2 negative by FISH ratio 1.28 T1 a N0 stage Ia  Pathology counseling: I discussed the final pathology report of the patient provided  a copy of this report. I discussed the margins as well as lymph node surgeries. We also discussed the final staging along with previously performed ER/PR and HER-2/neu testing.  Treatment plan: 1.  Adjuvant radiation therapy 04/18/20- 05/14/20 2. followed by adjuvant antiestrogen therapy  Anastrozole counseling: We discussed the risks and benefits of anti-estrogen therapy with aromatase inhibitors. These include but not limited to insomnia, hot flashes, mood changes, vaginal dryness, bone density loss, and weight gain. We strongly believe that the benefits far outweigh the risks. Patient understands these risks and consented to starting treatment. Planned treatment duration is 7 years.  RTC in 3 months for SCP visit

## 2020-05-13 ENCOUNTER — Other Ambulatory Visit: Payer: Self-pay

## 2020-05-13 ENCOUNTER — Inpatient Hospital Stay (HOSPITAL_BASED_OUTPATIENT_CLINIC_OR_DEPARTMENT_OTHER): Payer: Medicare Other | Admitting: Hematology and Oncology

## 2020-05-13 ENCOUNTER — Ambulatory Visit
Admission: RE | Admit: 2020-05-13 | Discharge: 2020-05-13 | Disposition: A | Discharge status: Home / Self Care | Payer: Medicare Other | Source: Ambulatory Visit | Attending: Radiation Oncology | Admitting: Radiation Oncology

## 2020-05-13 ENCOUNTER — Encounter: Payer: Self-pay | Admitting: *Deleted

## 2020-05-13 VITALS — BP 169/86 | HR 67 | Temp 97.5°F | Resp 17 | Ht 69.0 in | Wt 144.9 lb

## 2020-05-13 DIAGNOSIS — Z79899 Other long term (current) drug therapy: Secondary | ICD-10-CM | POA: Insufficient documentation

## 2020-05-13 DIAGNOSIS — Z780 Asymptomatic menopausal state: Secondary | ICD-10-CM

## 2020-05-13 DIAGNOSIS — Z170 Estrogen receptor positive status [ER+]: Secondary | ICD-10-CM | POA: Insufficient documentation

## 2020-05-13 DIAGNOSIS — C50412 Malignant neoplasm of upper-outer quadrant of left female breast: Secondary | ICD-10-CM | POA: Insufficient documentation

## 2020-05-13 DIAGNOSIS — Z923 Personal history of irradiation: Secondary | ICD-10-CM | POA: Insufficient documentation

## 2020-05-13 MED ORDER — ANASTROZOLE 1 MG PO TABS: 1.0000 mg | ORAL_TABLET | Freq: Every day | ORAL | 3 refills | Status: DC

## 2020-05-14 ENCOUNTER — Other Ambulatory Visit: Payer: Self-pay

## 2020-05-14 ENCOUNTER — Ambulatory Visit
Admission: RE | Admit: 2020-05-14 | Discharge: 2020-05-14 | Disposition: A | Discharge status: Home / Self Care | Payer: Medicare Other | Source: Ambulatory Visit | Attending: Radiation Oncology | Admitting: Radiation Oncology

## 2020-05-14 ENCOUNTER — Encounter: Payer: Self-pay | Admitting: Radiation Oncology

## 2020-05-14 DIAGNOSIS — Z170 Estrogen receptor positive status [ER+]: Secondary | ICD-10-CM

## 2020-05-14 DIAGNOSIS — C50412 Malignant neoplasm of upper-outer quadrant of left female breast: Secondary | ICD-10-CM

## 2020-05-14 MED ORDER — RADIAPLEXRX EX GEL: Freq: Once | CUTANEOUS | Status: AC

## 2020-06-06 ENCOUNTER — Encounter: Payer: Self-pay | Admitting: *Deleted

## 2020-06-13 ENCOUNTER — Ambulatory Visit
Admission: RE | Admit: 2020-06-13 | Discharge: 2020-06-13 | Disposition: A | Discharge status: Home / Self Care | Payer: Medicare Other | Source: Ambulatory Visit | Attending: Radiation Oncology | Admitting: Radiation Oncology

## 2020-06-13 ENCOUNTER — Encounter: Payer: Self-pay | Admitting: Radiation Oncology

## 2020-06-13 ENCOUNTER — Other Ambulatory Visit: Payer: Self-pay

## 2020-06-13 DIAGNOSIS — C50412 Malignant neoplasm of upper-outer quadrant of left female breast: Secondary | ICD-10-CM | POA: Insufficient documentation

## 2020-06-13 DIAGNOSIS — Z170 Estrogen receptor positive status [ER+]: Secondary | ICD-10-CM | POA: Diagnosis not present

## 2020-06-13 NOTE — Progress Notes (Signed)
Radiation Oncology         [phone] ________________________________  Name: Hailey Daniels MRN: [id number]  Date: 06/13/2020  DOB: 10/30/1945  Follow-Up Visit Note  CC: Tisovec, Fransico Him, MD  Nicholas Lose, MD    ICD-10-CM   1. Malignant neoplasm of upper-outer quadrant of left breast in female, estrogen receptor positive ([place])  C50.412    Z17.0     Diagnosis:  Stage IA (pT1a, cN0, cM0)LeftBreast UOQ,Invasive Ductal Carcinoma with DCIS, ER+/ PR+/ Her2-, Grade1  Interval Since Last Radiation: One month and two days  Radiation Treatment Dates: 04/17/2020 through 05/14/2020  Site: Left breast Technique: 3D Total Dose (Gy): 40.05/40.05 Dose per Fx (Gy): 2.67 Completed Fx: 15/15 Beam Energies: 6X  Site: Left breast boost Technique: 3D Total Dose (Gy): 10/10 Dose per Fx (Gy): 2 Completed Fx: 5/5 Beam Energies: 6X  Narrative:  The patient returns today for routine follow-up. She was last seen by Dr. Lindi Adie on 05/12/2020, at which time they discussed beginning adjuvant antiestrogen therapy with Anastrozole.  On review of systems, she reports she reports mild fatigue at this point but overall has recovered well. She denies itching or discomfort within the breast area.  She has started anastrozole and other than some hot flashes is tolerating this medication well.                ALLERGIES:  has No Known Allergies.  Meds: Current Outpatient Medications  Medication Sig Dispense Refill  . anastrozole (ARIMIDEX) 1 MG tablet Take 1 tablet (1 mg total) by mouth daily. 90 tablet 3  . atenolol (TENORMIN) 25 MG tablet Take 25 mg by mouth daily.    Marland Kitchen atorvastatin (LIPITOR) 20 MG tablet Take 1 tablet (20 mg total) by mouth daily. 30 tablet 6  . Cholecalciferol (VITAMIN D-3) 1000 units CAPS Take 2,000 Units by mouth daily.    . famotidine (PEPCID) 40 MG tablet TAKE ONE TABLET DAILY 90 tablet 3  . hydrochlorothiazide (HYDRODIURIL) 25 MG tablet Take 25 mg by mouth daily.    .  Multiple Vitamin (MULTIVITAMIN WITH MINERALS) TABS tablet Take 1 tablet by mouth daily.    . Omega-3 Fatty Acids (FISH OIL) 1200 MG CPDR Take by mouth daily at 6 (six) AM.    . omeprazole (PRILOSEC) 10 MG capsule Take 10 mg by mouth daily.    . polycarbophil (FIBERCON) 625 MG tablet Take 625 mg by mouth daily. Take 2 pills daily    . potassium chloride (K-DUR) 10 MEQ tablet Take 10 mEq by mouth daily.    . timolol (TIMOPTIC-XR) 0.5 % ophthalmic gel-forming Apply 1 drop to eye Daily. Both eyes     Current Facility-Administered Medications  Medication Dose Route Frequency Provider Last Rate Last Admin  . 0.9 %  sodium chloride infusion  500 mL Intravenous Once Nandigam, Venia Minks, MD        Physical Findings: The patient is in no acute distress. Patient is alert and oriented.  height is '5\' 9"'  (1.753 m) and weight is 147 lb (66.7 kg). Her temporal temperature is 96.9 F (36.1 C) (abnormal). Her blood pressure is 184/95 (abnormal) and her pulse is 63. Her respiration is 18 and oxygen saturation is 99%.  No significant changes. Lungs are clear to auscultation bilaterally. Heart has regular rate and rhythm. No palpable cervical, supraclavicular, or axillary adenopathy. Abdomen soft, non-tender, normal bowel sounds. Right breast: No palpable mass, nipple discharge, or bleeding.  Left breast: Skin is healed well.  She has  mild edema in the nipple areolar complex area but overall no significant swelling in the breast or significant induration.  No dominant mass appreciated in the breast nipple discharge or bleeding.  Lab Findings: Lab Results  Component Value Date   WBC 5.1 02/07/2020   HGB 15.5 (H) 02/07/2020   HCT 45.0 02/07/2020   MCV 99.8 02/07/2020   PLT 216 02/07/2020    Radiographic Findings: No results found.  Impression:  Stage IA (pT1a, cN0, cM0)LeftBreast UOQ,Invasive Ductal Carcinoma with DCIS, ER+/ PR+/ Her2-, Grade1  The patient has recovered well from radiation therapy.   No signs of recurrence on clinical exam today  Plan: The patient is scheduled to follow up with Wilber Bihari, NP, on 08/15/2020. She will follow up with radiation oncology in as needed basis in light of her close follow-up with medical oncology.    ____________________________________   Blair Promise, PhD, MD  This document serves as a record of services personally performed by Gery Pray, MD. It was created on his behalf by Clerance Lav, a trained medical scribe. The creation of this record is based on the scribe's personal observations and the provider's statements to them. This document has been checked and approved by the attending provider.

## 2020-06-13 NOTE — Progress Notes (Incomplete)
  Patient Name: Hailey Daniels MRN: [id number] DOB: [birth year] Referring Physician: Nicholas Lose (Profile Not Attached) Date of Service: 05/14/2020 [facility], [place]                                                        End Of Treatment Note  Diagnoses: C50.412-Malignant neoplasm of upper-outer quadrant of left female breast  Cancer Staging: Stage IA (pT1a, cN0, cM0)LeftBreast UOQ,Invasive Ductal Carcinoma with DCIS, ER+/ PR+/ Her2-, Grade1  Intent: Curative  Radiation Treatment Dates: 04/17/2020 through 05/14/2020  Site: Left breast Technique: 3D Total Dose (Gy): 40.05/40.05 Dose per Fx (Gy): 2.67 Completed Fx: 15/15 Beam Energies: 6X  Site: Left breast boost Technique: 3D Total Dose (Gy): 10/10 Dose per Fx (Gy): 2 Completed Fx: 5/5 Beam Energies: 6X  Narrative: The patient tolerated radiation therapy relatively well. She did report some mild tenderness to the left breast, mild itching/dermatitis to the left breast, and mild fatigue. Appetite remained stable. Denied limitations in movement of left arm, chest pain, swelling of the left axilla, and shortness of breath.  The left breast area showed some slight hyperpigmentation changes during the beginning of treatment but there were no signs of infection. As treatment continued, the patient developed some diffuse erythema and radiation dermatitis to the treatment field and a small area of irritation in the upper portion of the chest wall for which she was advised to use triple antibiotic ointment. No moist desquamation.  Plan: The patient will follow-up with radiation oncology in one month.  ________________________________________________   Blair Promise, PhD, MD  This document serves as a record of services personally performed by Gery Pray,  MD. It was created on his behalf by Clerance Lav, a trained medical scribe. The creation of this record is based on the scribe's personal observations and the provider's statements to them. This document has been checked and approved by the attending provider.

## 2020-06-13 NOTE — Progress Notes (Signed)
Patient is present for follow up of breast radiation completed 05/14/2020. Denies pain. Patient reports fild fatigue. Skin irritation has resolved. Denies swelling of breast or arm. Range of motion is normal.   Vitals:   06/13/20 1013  BP: (!) 184/95  Pulse: 63  Resp: 18  Temp: (!) 96.9 F (36.1 C)  TempSrc: Temporal  SpO2: 99%  Weight: 66.7 kg  Height: 5\' 9"  (1.753 m)

## 2020-06-21 ENCOUNTER — Telehealth: Payer: Self-pay | Admitting: Oncology

## 2020-06-21 NOTE — Telephone Encounter (Signed)
Cancelled 3/29 appt per patient request in sch msg. Called and left msg for patient to call back if she wants to reschedule and that the appt would be at the [place] location if she reschedules for April

## 2020-06-25 ENCOUNTER — Ambulatory Visit: Payer: Medicare Other | Admitting: Oncology

## 2020-07-17 DIAGNOSIS — L570 Actinic keratosis: Secondary | ICD-10-CM | POA: Diagnosis not present

## 2020-07-17 DIAGNOSIS — D1722 Benign lipomatous neoplasm of skin and subcutaneous tissue of left arm: Secondary | ICD-10-CM | POA: Diagnosis not present

## 2020-07-17 DIAGNOSIS — L821 Other seborrheic keratosis: Secondary | ICD-10-CM | POA: Diagnosis not present

## 2020-07-17 DIAGNOSIS — D1801 Hemangioma of skin and subcutaneous tissue: Secondary | ICD-10-CM | POA: Diagnosis not present

## 2020-07-17 DIAGNOSIS — Z85828 Personal history of other malignant neoplasm of skin: Secondary | ICD-10-CM | POA: Diagnosis not present

## 2020-07-17 DIAGNOSIS — D2262 Melanocytic nevi of left upper limb, including shoulder: Secondary | ICD-10-CM | POA: Diagnosis not present

## 2020-07-29 ENCOUNTER — Encounter: Payer: Self-pay | Admitting: Gastroenterology

## 2020-08-07 DIAGNOSIS — Z23 Encounter for immunization: Secondary | ICD-10-CM | POA: Diagnosis not present

## 2020-08-15 ENCOUNTER — Inpatient Hospital Stay: Payer: Medicare Other | Attending: Adult Health | Admitting: Adult Health

## 2020-08-15 ENCOUNTER — Other Ambulatory Visit: Payer: Self-pay

## 2020-08-15 ENCOUNTER — Telehealth: Payer: Self-pay | Admitting: Adult Health

## 2020-08-15 ENCOUNTER — Encounter: Payer: Self-pay | Admitting: Adult Health

## 2020-08-15 VITALS — BP 160/90 | HR 67 | Temp 97.4°F | Resp 18 | Ht 69.0 in | Wt 146.5 lb

## 2020-08-15 DIAGNOSIS — R5383 Other fatigue: Secondary | ICD-10-CM | POA: Diagnosis not present

## 2020-08-15 DIAGNOSIS — E785 Hyperlipidemia, unspecified: Secondary | ICD-10-CM | POA: Insufficient documentation

## 2020-08-15 DIAGNOSIS — C50412 Malignant neoplasm of upper-outer quadrant of left female breast: Secondary | ICD-10-CM | POA: Diagnosis not present

## 2020-08-15 DIAGNOSIS — Z923 Personal history of irradiation: Secondary | ICD-10-CM | POA: Insufficient documentation

## 2020-08-15 DIAGNOSIS — I10 Essential (primary) hypertension: Secondary | ICD-10-CM | POA: Diagnosis not present

## 2020-08-15 DIAGNOSIS — Z170 Estrogen receptor positive status [ER+]: Secondary | ICD-10-CM | POA: Diagnosis not present

## 2020-08-15 DIAGNOSIS — K219 Gastro-esophageal reflux disease without esophagitis: Secondary | ICD-10-CM | POA: Diagnosis not present

## 2020-08-15 DIAGNOSIS — Z79811 Long term (current) use of aromatase inhibitors: Secondary | ICD-10-CM | POA: Insufficient documentation

## 2020-08-15 DIAGNOSIS — E2839 Other primary ovarian failure: Secondary | ICD-10-CM

## 2020-08-15 DIAGNOSIS — Z79899 Other long term (current) drug therapy: Secondary | ICD-10-CM | POA: Insufficient documentation

## 2020-08-15 MED ORDER — EXEMESTANE 25 MG PO TABS: 25.0000 mg | ORAL_TABLET | Freq: Every day | ORAL | 5 refills | Status: DC

## 2020-08-15 NOTE — Progress Notes (Signed)
SURVIVORSHIP  VISIT:   BRIEF ONCOLOGIC HISTORY:  Oncology History  Malignant neoplasm of upper-outer quadrant of left breast in female, estrogen receptor positive ([place])  02/01/2020 Initial Diagnosis   Screening mammogram detected an architectural distortion in the left breast, 1.8cm at the 2 o'clock position. Biopsy showed invasive and in situ mammary carcinoma, grade 1-2, HER-2 equivocal by IHC, negative by FISH (ratio 1.28), ER/PR+ >95%, Ki67 5%.   02/07/2020 Cancer Staging   Staging form: Breast, AJCC 8th Edition - Clinical stage from 02/07/2020: Stage IA (cT1a, cN0, cM0, G2, ER+, PR+, HER2-)   03/13/2020 Surgery   Left lumpectomy Donne Hazel) [phone]): invasive and in situ ductal carcinoma, grade 1, 0.4cm, clear margins. No regional lymph nodes were examined.   04/27/2020 - 05/14/2020 Radiation Therapy   The patient initially received a dose of 40.05 Gy in 15 fractions to the breast using whole-breast tangent fields. This was delivered using a 3-D conformal technique. The pt received a boost delivering an additional 10 Gy in 5 fractions using a electron boost with 11mV electrons. The total dose was 50.05 Gy.   04/2020 - 05/2027 Anti-estrogen oral therapy   Anastrozole     INTERVAL HISTORY:  Hailey Daniels review her survivorship care plan detailing her treatment course for breast cancer, as well as monitoring long-term side effects of that treatment, education regarding health maintenance, screening, and overall wellness and health promotion.     Overall, Hailey Daniels feeling quite well.  She completed the survivorhsip survey.  Her two main issues are fatigue, and hot flashes.  The hot flashes have become so unbearable she has stopped her anastrozole to gain relief.  She notes they are happening every hour and through the night.  She wants to know if there is a different medication she can take.  The other concern is due to her fatigue, and that started during her cancer  treatment.  She does not snore, and she is doing well otherwise.  Otherwise, her survey was stable.  REVIEW OF SYSTEMS:  Review of Systems  Constitutional: Negative for appetite change, chills, fatigue, fever and unexpected weight change.  HENT:   Negative for hearing loss, lump/mass and trouble swallowing.   Eyes: Negative for eye problems and icterus.  Respiratory: Negative for chest tightness, cough and shortness of breath.   Cardiovascular: Negative for chest pain, leg swelling and palpitations.  Gastrointestinal: Negative for abdominal distention, abdominal pain, constipation, diarrhea, nausea and vomiting.  Endocrine: Negative for hot flashes.  Genitourinary: Negative for difficulty urinating.   Musculoskeletal: Negative for arthralgias.  Skin: Negative for itching and rash.  Neurological: Negative for dizziness, extremity weakness, headaches and numbness.  Hematological: Negative for adenopathy. Does not bruise/bleed easily.  Psychiatric/Behavioral: Negative for depression. The patient is not nervous/anxious.    Breast: Denies any new nodularity, masses, tenderness, nipple changes, or nipple discharge.      ONCOLOGY TREATMENT TEAM:  1. Surgeon:  [facility]ne [facility] 2. Medical Oncologist: Dr. GLindi Adie 3. Radiation Oncologist: Dr. KSondra Come   PAST MEDICAL/SURGICAL HISTORY:  Past Medical History:  Diagnosis Date  . Barrett esophagus   . [place]   . [facility]   tumor in stomach  . Diverticulosis   . Gastrointestinal stromal tumor of stomach ([facility] 2006   no chemo/ in research study in 2006/ no meds now  . GERD (gastroesophageal reflux disease)    on meds  . Glaucoma   . Hiatal hernia   .  History of radiation therapy 04/17/2020-05/14/2020   Left Breast; Dr. Gery Pray  . Hypercholesteremia   . Hypertension    Past Surgical History:  Procedure Laterality Date  . BREAST BIOPSY Left   . BREAST LUMPECTOMY WITH RADIOACTIVE SEED  LOCALIZATION Left 03/13/2020   Procedure: LEFT BREAST LUMPECTOMY WITH RADIOACTIVE SEED LOCALIZATION;  Surgeon: Rolm Bookbinder, MD;  Location: [facility];  Service: General;  Laterality: Left;  . COLONOSCOPY    . EXPLORATORY LAPAROTOMY  06/12/2004   resection of gastric tumor  . STOMACH SURGERY    . tear duct surgery     Bil/made a new opening in tear duct     ALLERGIES:  No Known Allergies   CURRENT MEDICATIONS:  Outpatient Encounter Medications as of 08/15/2020  Medication Sig  . anastrozole (ARIMIDEX) 1 MG tablet Take 1 tablet (1 mg total) by mouth daily.  Marland Kitchen atenolol (TENORMIN) 25 MG tablet Take 25 mg by mouth daily.  Marland Kitchen atorvastatin (LIPITOR) 20 MG tablet Take 1 tablet (20 mg total) by mouth daily.  . Cholecalciferol (VITAMIN D-3) 1000 units CAPS Take 2,000 Units by mouth daily.  . famotidine (PEPCID) 40 MG tablet TAKE ONE TABLET DAILY  . hydrochlorothiazide (HYDRODIURIL) 25 MG tablet Take 25 mg by mouth daily.  . Multiple Vitamin (MULTIVITAMIN WITH MINERALS) TABS tablet Take 1 tablet by mouth daily.  . Omega-3 Fatty Acids (FISH OIL) 1200 MG CPDR Take by mouth daily at 6 (six) AM.  . omeprazole (PRILOSEC) 10 MG capsule Take 10 mg by mouth daily.  . polycarbophil (FIBERCON) 625 MG tablet Take 625 mg by mouth daily. Take 2 pills daily  . potassium chloride (K-DUR) 10 MEQ tablet Take 10 mEq by mouth daily.  . timolol (TIMOPTIC-XR) 0.5 % ophthalmic gel-forming Apply 1 drop to eye Daily. Both eyes   Facility-Administered Encounter Medications as of 08/15/2020  Medication  . 0.9 %  sodium chloride infusion     ONCOLOGIC FAMILY HISTORY:  Family History  Problem Relation Age of Onset  . Lung cancer Mother   . Other Father        brain tumor  . Colon cancer Neg Hx   . Pancreatic cancer Neg Hx   . Stomach cancer Neg Hx   . Rectal cancer Neg Hx   . Esophageal cancer Neg Hx      GENETIC COUNSELING/TESTING: No  SOCIAL HISTORY:  Social History    Socioeconomic History  . Marital status: Married    Spouse name: Not on file  . Number of children: 3  . Years of education: Not on file  . Highest education level: Not on file  Occupational History  . Occupation: retired  Tobacco Use  . Smoking status: Never Smoker  . Smokeless tobacco: Never Used  Vaping Use  . Vaping Use: Never used  Substance and Sexual Activity  . Alcohol use: Yes    Alcohol/week: 7.0 standard drinks    Types: 7 Glasses of wine per week  . Drug use: No  . Sexual activity: Not on file  Other Topics Concern  . Not on file  Social History Narrative  . Not on file   Social Determinants of Health   Financial Resource Strain: Low Risk   . Difficulty of Paying Living Expenses: Not hard at all  Food Insecurity: No Food Insecurity  . Worried About Charity fundraiser in the Last Year: Never true  . Ran Out of Food in the Last Year: Never true  Transportation Needs:  No Transportation Needs  . Lack of Transportation (Medical): No  . Lack of Transportation (Non-Medical): No  Physical Activity: Not on file  Stress: Not on file  Social Connections: Not on file  Intimate Partner Violence: Not on file     OBSERVATIONS/OBJECTIVE:  BP (!) 160/90 (BP Location: Left Arm, Patient Position: Sitting)   Pulse 67   Temp (!) 97.4 F (36.3 C) (Tympanic)   Resp 18   Ht 5' 9" (1.753 m)   Wt 146 lb 8 oz (66.5 kg)   SpO2 98%   BMI 21.63 kg/m  GENERAL: Patient is a well appearing female in no acute distress HEENT:  Sclerae anicteric.  Oropharynx clear and moist. No ulcerations or evidence of oropharyngeal candidiasis. Neck is supple.  NODES:  No cervical, supraclavicular, or axillary lymphadenopathy palpated.  BREAST EXAM:  Left breast s/p lumpectomy and radiation, no sign of local recurrence, right breast benign LUNGS:  Clear to auscultation bilaterally.  No wheezes or rhonchi. HEART:  Regular rate and rhythm. No murmur appreciated. ABDOMEN:  Soft, nontender.   Positive, normoactive bowel sounds. No organomegaly palpated. MSK:  No focal spinal tenderness to palpation. Full range of motion bilaterally in the upper extremities. EXTREMITIES:  No peripheral edema.   SKIN:  Clear with no obvious rashes or skin changes. No nail dyscrasia. NEURO:  Nonfocal. Well oriented.  Appropriate affect.    LABORATORY DATA:  None for this visit.  DIAGNOSTIC IMAGING:  None for this visit.      ASSESSMENT AND PLAN:  Hailey Daniels is a pleasant 75 y.o. female with Stage IA left breast invasive ductal carcinoma, ER+/PR+/HER2-, diagnosed in 01/2020, treated with lumpectomy, adjuvant radiation therapy, and anti-estrogen therapy with Anastrozole beginning in March, 2022.  She presents to the Survivorship Clinic for our initial meeting and routine follow-up post-completion of treatment for breast cancer.    1. Stage IA left breast cancer:  Hailey Daniels is continuing to recover from definitive treatment for breast cancer. She will follow-up with her medical oncologist, Dr. Lindi Adie in 6 months with history and physical exam per surveillance protocol.  Her mammogram is due 11/2020; orders placed today. Today, a comprehensive survivorship care plan and treatment summary was reviewed with the patient today detailing her breast cancer diagnosis, treatment course, potential late/long-term effects of treatment, appropriate follow-up care with recommendations for the future, and patient education resources.  A copy of this summary, along with a letter will be sent to the patient's primary care provider via mail/fax/In Basket message after today's visit.    2. Hot flashes: She has stopped anastrozole, we discussed this in detail and reviewed her options, which include changing therapy to another aromatase inhibitor, starting medication to reduce the hot flashes, non pharmacologic interventions which she has already tried, or reducing the Anastrozole briefly and then increasing it.   After our discussion, she is going to try taking Exemestane.  If she still experiences these profound hot flashes, she will resume anastrozole at half dose for 2-3 months then increase.  She understands that there is no data on decreasing the dose in invasive breast cancer patients that I am aware of.  She will call us to let us know.  3. Fatigue: We discussed that this is likely a long term side effect from the radiation, and should gradually improve.  It is also compounded by the hot flashes and sleep disturbance from experiencing these through the night.  Should this worsen, she was recommended to reach out, because at  that point we would recommend further laboratory evaluation.   4. Bone health:  Given Hailey Daniels's age/history of breast cancer and her current treatment regimen including anti-estrogen therapy with aromatase inhibitors, she is at risk for bone demineralization.  She has not undergone bone density testing, and so I have placed orders for her to do [facility] at [facility] when she undergoes her mammogram. She was given education on specific activities to promote bone health.  5. Cancer screening:  Due to Hailey Daniels's history and her age, she should receive screening for skin cancers, colon cancer, and gynecologic cancers.  The information and recommendations are listed on the patient's comprehensive care plan/treatment summary and were reviewed in detail with the patient.    6. Health maintenance and wellness promotion: Hailey Daniels was encouraged to consume 5-7 servings of fruits and vegetables per day. We reviewed the "Nutrition Rainbow" handout, as well as the handout "Take Control of Your Health and Reduce Your Cancer Risk" from the [place].  She was also encouraged to engage in moderate to vigorous exercise for 30 minutes per day most days of the week. We discussed the [employer] fitness program, which is designed for cancer survivors to help them become more  physically fit after cancer treatments.  She was instructed to limit her alcohol consumption and continue to abstain from tobacco use.     7. Support services/counseling: It is not uncommon for this period of the patient's cancer care trajectory to be one of many emotions and stressors.  We discussed how this can be increasingly difficult during the times of quarantine and social distancing due to the COVID-19 pandemic.   She was given information regarding our available services and encouraged to contact me with any questions or for help enrolling in any of our support group/programs.    Follow up instructions:    -Return to cancer center in 6 months for f/u with Dr. Lindi Adie  -Mammogram due in 11/2020 -Bone density due in 11/2020 -She is welcome to return back to the Survivorship Clinic at any time; no additional follow-up needed at this time.  -Consider referral back to survivorship as a long-term survivor for continued surveillance  The patient was provided an opportunity to ask questions and all were answered. The patient agreed with the plan and demonstrated an understanding of the instructions.   Total encounter time: 48 minutes spent reviewing and preparing SCP documentation, face to face visit time, placing orders, communicating and collaborating with staff, and in documentation.  Wilber Bihari, NP 08/15/20 9:46 AM Medical Oncology and Hematology [facility] [place], [place] [address] Tel. [phone]    Fax. [phone]  *Total Encounter Time as defined by the Centers for Medicare and Medicaid Services includes, in addition to the face-to-face time of a patient visit (documented in the note above) non-face-to-face time: obtaining and reviewing outside history, ordering and reviewing medications, tests or procedures, care coordination (communications with other health care professionals or caregivers) and documentation in the medical record.

## 2020-08-15 NOTE — Telephone Encounter (Signed)
Scheduled per 05/19 los. Patient is aware.

## 2020-08-29 ENCOUNTER — Encounter: Payer: Self-pay | Admitting: Oncology

## 2020-09-05 ENCOUNTER — Telehealth: Payer: Self-pay | Admitting: Hematology and Oncology

## 2020-09-05 NOTE — Telephone Encounter (Signed)
Scheduled appointment per 06/09 sch msg. Patient is aware. 

## 2020-09-10 DIAGNOSIS — H401131 Primary open-angle glaucoma, bilateral, mild stage: Secondary | ICD-10-CM | POA: Diagnosis not present

## 2020-09-10 DIAGNOSIS — H2513 Age-related nuclear cataract, bilateral: Secondary | ICD-10-CM | POA: Diagnosis not present

## 2020-09-10 DIAGNOSIS — H5213 Myopia, bilateral: Secondary | ICD-10-CM | POA: Diagnosis not present

## 2020-09-14 NOTE — Progress Notes (Signed)
 Patient Care Team: Tisovec, Richard W, MD as PCP - General (Internal Medicine) Ingram, Haywood, MD (General Surgery) Sherrill, Gary B, MD as Attending Physician (Hematology and Oncology) Brodie, Dora M, MD (Inactive) (Gastroenterology) Martini, Keisha N, RN as Oncology Nurse Navigator Stuart, Dawn C, RN as Oncology Nurse Navigator Wakefield, Matthew, MD as Consulting Physician (General Surgery) Gudena, Vinay, MD as Consulting Physician (Hematology and Oncology) Kinard, James, MD as Consulting Physician (Radiation Oncology)  DIAGNOSIS:    ICD-10-CM   1. Malignant neoplasm of upper-outer quadrant of left breast in female, estrogen receptor positive ([facility])  C50.412    Z17.0       SUMMARY OF ONCOLOGIC HISTORY: Oncology History  Malignant neoplasm of upper-outer quadrant of left breast in female, estrogen receptor positive ([facility])  02/01/2020 Initial Diagnosis   Screening mammogram detected an architectural distortion in the left breast, 1.8cm at the 2 o'clock position. Biopsy showed invasive and in situ mammary carcinoma, grade 1-2, HER-2 equivocal by IHC, negative by FISH (ratio 1.28), ER/PR+ >95%, Ki67 5%.   02/07/2020 Cancer Staging   Staging form: Breast, AJCC 8th Edition - Clinical stage from 02/07/2020: Stage IA (cT1a, cN0, cM0, G2, ER+, PR+, HER2-)   03/13/2020 Surgery   Left lumpectomy ([place]) ([id number]): invasive and in situ ductal carcinoma, grade 1, 0.4cm, clear margins. No regional lymph nodes were examined.   04/27/2020 - 05/14/2020 Radiation Therapy   The patient initially received a dose of 40.05 Gy in 15 fractions to the breast using whole-breast tangent fields. This was delivered using a 3-D conformal technique. The pt received a boost delivering an additional 10 Gy in 5 fractions using a electron boost with 15meV electrons. The total dose was 50.05 Gy.   04/2020 - 05/2027 Anti-estrogen oral therapy   Anastrozole     CHIEF COMPLIANT: Follow-up for left breast  cancer  INTERVAL HISTORY: Hailey Daniels is a 74 y.o. with above-mentioned history of left breast cancer who underwent a left lumpectomy and is currently undergoing radiation. She reports to the clinic today for follow-up.  She could not tolerate antiestrogen therapy with anastrozole.  She has severe hot flashes that were relentless.  Because of quality of life issues she stopped it and her symptoms have improved significantly.  [name] saw her and prescribed her exemestane but she has not taken it yet.  She wanted to sit down with me to discuss the pros and cons of antiestrogen therapy.  ALLERGIES:  has No Known Allergies.  MEDICATIONS:  Current Outpatient Medications  Medication Sig Dispense Refill   atenolol (TENORMIN) 25 MG tablet Take 25 mg by mouth daily.     atorvastatin (LIPITOR) 20 MG tablet Take 1 tablet (20 mg total) by mouth daily. 30 tablet 6   Cholecalciferol (VITAMIN D-3) 1000 units CAPS Take 2,000 Units by mouth daily.     exemestane (AROMASIN) 25 MG tablet Take 1 tablet (25 mg total) by mouth daily after breakfast. 30 tablet 5   famotidine (PEPCID) 40 MG tablet TAKE ONE TABLET DAILY 90 tablet 3   hydrochlorothiazide (HYDRODIURIL) 25 MG tablet Take 25 mg by mouth daily.     Multiple Vitamin (MULTIVITAMIN WITH MINERALS) TABS tablet Take 1 tablet by mouth daily.     Omega-3 Fatty Acids (FISH OIL) 1200 MG CPDR Take by mouth daily at 6 (six) AM.     omeprazole (PRILOSEC) 10 MG capsule Take 10 mg by mouth daily.     polycarbophil (FIBERCON) 625 MG tablet Take 625 mg by mouth daily.   Take 2 pills daily     potassium chloride (K-DUR) 10 MEQ tablet Take 10 mEq by mouth daily.     timolol (TIMOPTIC-XR) 0.5 % ophthalmic gel-forming Apply 1 drop to eye Daily. Both eyes     Current Facility-Administered Medications  Medication Dose Route Frequency Provider Last Rate Last Admin   0.9 %  sodium chloride infusion  500 mL Intravenous Once Nandigam, Venia Minks, MD        PHYSICAL  EXAMINATION: ECOG PERFORMANCE STATUS: 1 - Symptomatic but completely ambulatory  Vitals:   09/16/20 1502  BP: (!) 181/85  Pulse: 75  Resp: 18  Temp: (!) 97.3 F (36.3 C)  SpO2: 98%   Filed Weights   09/16/20 1502  Weight: 146 lb 11.2 oz (66.5 kg)    LABORATORY DATA:  I have reviewed the data as listed CMP Latest Ref Rng & Units 03/07/2020 02/07/2020 06/27/2013  Glucose 70 - 99 mg/dL 85 97 -  BUN 8 - 23 mg/dL 10 14 -  Creatinine 0.44 - 1.00 mg/dL 0.60 0.65 -  Sodium 135 - 145 mmol/L 138 143 -  Potassium 3.5 - 5.1 mmol/L 4.3 3.5 -  Chloride 98 - 111 mmol/L 100 102 -  CO2 22 - 32 mmol/L 27 31 -  Calcium 8.9 - 10.3 mg/dL 9.6 10.1 -  Total Protein 6.5 - 8.1 g/dL - 7.4 7.2  Total Bilirubin 0.3 - 1.2 mg/dL - 0.8 0.8  Alkaline Phos 38 - 126 U/L - 60 51  AST 15 - 41 U/L - 18 21  ALT 0 - 44 U/L - 36 39(H)    Lab Results  Component Value Date   WBC 5.1 02/07/2020   HGB 15.5 (H) 02/07/2020   HCT 45.0 02/07/2020   MCV 99.8 02/07/2020   PLT 216 02/07/2020   NEUTROABS 2.7 02/07/2020    ASSESSMENT & PLAN:  Malignant neoplasm of upper-outer quadrant of left breast in female, estrogen receptor positive ([place]) 03/13/2020:Left lumpectomy Hailey Daniels): invasive and in situ ductal carcinoma, grade 1, 0.4cm, clear margins ER/PR greater than 95%, Ki-67 5%, HER-2 negative by FISH ratio 1.28 T1 a N0 stage Ia  Treatment plan: 1.  Adjuvant radiation therapy 04/18/20- 05/14/20 2. followed by adjuvant antiestrogen therapy started February 2022 Bone density ordered at [facility]  Anastrozole Toxicities: Severe hot flashes: Several times throughout the day making her life extremely miserable.  Because of this that she discontinued it and her symptoms went away.  She was given a different prescription for exemestane but she has not tried it.  She does not want to take any further antiestrogen therapy. I believe her risk of recurrence is very low given the favorable type of breast cancer.  Breast Cancer  Surveillance: Mammograms to be done in September at [place].  RTC in 1 year for follow-up    No orders of the defined types were placed in this encounter.  The patient has a good understanding of the overall plan. she agrees with it. she will call with any problems that may develop before the next visit here.  Total time spent: 20 mins including face to face time and time spent for planning, charting and coordination of care  Rulon Eisenmenger, MD, MPH 09/16/2020  I, Thana Ates, am acting as scribe for Dr. Nicholas Lose.  I have reviewed the above documentation for accuracy and completeness, and I agree with the above.

## 2020-09-15 NOTE — Assessment & Plan Note (Signed)
03/13/2020:Left lumpectomy Hailey Daniels): invasive and in situ ductal carcinoma, grade 1, 0.4cm, clear marginsER/PR greater than 95%, Ki-67 5%, HER-2 negative by FISH ratio 1.28 T1 a N0 stage Ia  Treatment plan: 1.Adjuvant radiation therapy 04/18/20- 05/14/20 2.followed by adjuvant antiestrogen therapy [facility] density ordered at [facility]  Anastrozole Toxicities:  Breast Cancer Surveillance:  RTC in 3 months for SCP visit

## 2020-09-16 ENCOUNTER — Other Ambulatory Visit: Payer: Self-pay

## 2020-09-16 ENCOUNTER — Inpatient Hospital Stay: Payer: Medicare Other | Attending: Adult Health | Admitting: Hematology and Oncology

## 2020-09-16 DIAGNOSIS — C50412 Malignant neoplasm of upper-outer quadrant of left female breast: Secondary | ICD-10-CM | POA: Diagnosis not present

## 2020-09-16 DIAGNOSIS — Z79899 Other long term (current) drug therapy: Secondary | ICD-10-CM | POA: Insufficient documentation

## 2020-09-16 DIAGNOSIS — Z923 Personal history of irradiation: Secondary | ICD-10-CM | POA: Insufficient documentation

## 2020-09-16 DIAGNOSIS — Z79811 Long term (current) use of aromatase inhibitors: Secondary | ICD-10-CM | POA: Diagnosis not present

## 2020-09-16 DIAGNOSIS — Z170 Estrogen receptor positive status [ER+]: Secondary | ICD-10-CM | POA: Diagnosis not present

## 2020-09-18 ENCOUNTER — Telehealth: Payer: Self-pay | Admitting: Hematology and Oncology

## 2020-09-18 NOTE — Telephone Encounter (Signed)
Scheduled per 6/20 los. Called pt and left a msg

## 2020-10-22 DIAGNOSIS — D225 Melanocytic nevi of trunk: Secondary | ICD-10-CM | POA: Diagnosis not present

## 2020-10-22 DIAGNOSIS — Z85828 Personal history of other malignant neoplasm of skin: Secondary | ICD-10-CM | POA: Diagnosis not present

## 2020-10-22 DIAGNOSIS — L570 Actinic keratosis: Secondary | ICD-10-CM | POA: Diagnosis not present

## 2020-10-22 DIAGNOSIS — L821 Other seborrheic keratosis: Secondary | ICD-10-CM | POA: Diagnosis not present

## 2020-11-06 DIAGNOSIS — Z853 Personal history of malignant neoplasm of breast: Secondary | ICD-10-CM | POA: Diagnosis not present

## 2020-11-27 ENCOUNTER — Other Ambulatory Visit: Payer: Self-pay

## 2020-11-27 ENCOUNTER — Ambulatory Visit (AMBULATORY_SURGERY_CENTER): Payer: Medicare Other | Admitting: *Deleted

## 2020-11-27 VITALS — Ht 69.0 in | Wt 146.0 lb

## 2020-11-27 DIAGNOSIS — Z86010 Personal history of colonic polyps: Secondary | ICD-10-CM

## 2020-11-27 MED ORDER — PLENVU 140 G PO SOLR
1.0000 | Freq: Once | ORAL | 0 refills | Status: AC
Start: 2020-11-27 — End: 2020-11-27

## 2020-11-27 NOTE — Progress Notes (Signed)
Patient's pre-visit was done today over the phone with the patient due to COVID-19 pandemic. Name,DOB and address verified. Insurance verified. Patient denies any allergies to Eggs and Soy. Patient denies any problems with anesthesia/sedation. Patient denies taking diet pills or blood thinners. No home Oxygen. Packet of Prep instructions mailed to patient including a copy of a consent form-pt is aware. Patient understands to call us back with any questions or concerns. Patient is aware of our care-partner policy and 0000000 safety protocol.   The patient is COVID-19 vaccinated.   Plenvu coupon mailed to pt-pt requested the lower volume prep.

## 2020-12-11 ENCOUNTER — Encounter: Payer: Medicare Other | Admitting: Gastroenterology

## 2020-12-23 ENCOUNTER — Ambulatory Visit (AMBULATORY_SURGERY_CENTER): Payer: Medicare Other | Admitting: Gastroenterology

## 2020-12-23 ENCOUNTER — Other Ambulatory Visit: Payer: Self-pay

## 2020-12-23 ENCOUNTER — Encounter: Payer: Self-pay | Admitting: Gastroenterology

## 2020-12-23 VITALS — BP 186/101 | HR 72 | Temp 97.7°F | Resp 18 | Ht 69.0 in | Wt 146.0 lb

## 2020-12-23 DIAGNOSIS — Z86010 Personal history of colonic polyps: Secondary | ICD-10-CM

## 2020-12-23 DIAGNOSIS — D123 Benign neoplasm of transverse colon: Secondary | ICD-10-CM | POA: Diagnosis not present

## 2020-12-23 DIAGNOSIS — I10 Essential (primary) hypertension: Secondary | ICD-10-CM | POA: Diagnosis not present

## 2020-12-23 DIAGNOSIS — D122 Benign neoplasm of ascending colon: Secondary | ICD-10-CM

## 2020-12-23 MED ORDER — SODIUM CHLORIDE 0.9 % IV SOLN: 500.0000 mL | Freq: Once | INTRAVENOUS | Status: DC

## 2020-12-23 NOTE — Op Note (Signed)
[facility] Patient Name: Hailey Daniels Procedure Date: 12/23/2020 9:46 AM MRN: [id number] Endoscopist: Mauri Pole , MD Age: 75 Referring MD:  Date of Birth: [birth year] Gender: Female Account #: [id number] Procedure:                Colonoscopy Indications:              High risk colon cancer surveillance: Personal                            history of colonic polyps, High risk colon cancer                            surveillance: Personal history of adenoma (10 mm or                            greater in size) Medicines:                Monitored Anesthesia Care Procedure:                Pre-Anesthesia Assessment:                           - Prior to the procedure, a History and Physical                            was performed, and patient medications and                            allergies were reviewed. The patient's tolerance of                            previous anesthesia was also reviewed. The risks                            and benefits of the procedure and the sedation                            options and risks were discussed with the patient.                            All questions were answered, and informed consent                            was obtained. Prior Anticoagulants: The patient has                            taken no previous anticoagulant or antiplatelet                            agents. ASA Grade Assessment: II - A patient with                            mild systemic disease. After reviewing the risks  and benefits, the patient was deemed in                            satisfactory condition to undergo the procedure.                           After obtaining informed consent, the colonoscope                            was passed under direct vision. Throughout the                            procedure, the patient's blood pressure, pulse, and                            oxygen saturations were monitored  continuously. The                            Olympus PCF-H190DL (#[id number]) Colonoscope was                            introduced through the anus and advanced to the the                            cecum, identified by appendiceal orifice and                            ileocecal valve. The colonoscopy was performed                            without difficulty. The patient tolerated the                            procedure well. The quality of the bowel                            preparation was excellent. The ileocecal valve,                            appendiceal orifice, and rectum were photographed. Scope In: 9:51:36 AM Scope Out: 10:15:23 AM Scope Withdrawal Time: 0 hours 14 minutes 58 seconds  Total Procedure Duration: 0 hours 23 minutes 47 seconds  Findings:                 The perianal and digital rectal examinations were                            normal.                           Four sessile polyps were found in the ascending                            colon. The polyps were 3 to 8 mm in size. These  polyps were removed with a cold snare. Resection                            and retrieval were complete.                           A 12 mm polyp was found in the transverse colon.                            The polyp was semi-pedunculated. The polyp was                            removed with a hot snare. Resection and retrieval                            were complete.                           Scattered small and large-mouthed diverticula were                            found in the sigmoid colon and descending colon.                           Non-bleeding external and internal hemorrhoids were                            found during retroflexion. The hemorrhoids were                            medium-sized. Complications:            No immediate complications. Estimated Blood Loss:     Estimated blood loss was minimal. Impression:               - Four  3 to 8 mm polyps in the ascending colon,                            removed with a cold snare. Resected and retrieved.                           - One 12 mm polyp in the transverse colon, removed                            with a hot snare. Resected and retrieved.                           - Diverticulosis in the sigmoid colon and in the                            descending colon.                           - Non-bleeding external and internal hemorrhoids. Recommendation:           -  Patient has a contact number available for                            emergencies. The signs and symptoms of potential                            delayed complications were discussed with the                            patient. Return to normal activities tomorrow.                            Written discharge instructions were provided to the                            patient.                           - Resume previous diet.                           - Continue present medications.                           - Await pathology results.                           - Repeat colonoscopy in 3 years for surveillance                            based on pathology results.                           - Return to GI clinic PRN. Mauri Pole, MD 12/23/2020 10:21:26 AM This report has been signed electronically.

## 2020-12-23 NOTE — Progress Notes (Signed)
A and O x3. Report to RN. Tolerated MAC anesthesia well. 

## 2020-12-23 NOTE — Progress Notes (Signed)
Called to room to assist during endoscopic procedure.  Patient ID and intended procedure confirmed with present staff. Received instructions for my participation in the procedure from the performing physician.  

## 2020-12-23 NOTE — Progress Notes (Signed)
Pt's states no medical or surgical changes since previsit or office visit. VS assessed by C.W 

## 2020-12-23 NOTE — Patient Instructions (Signed)
Please read handouts provided. Continue present medications. Await pathology results. No non-steriodal anti-inflammatory medications for 2 weeks. Repeat colonoscopy in 3 years for screening. Return to GI clinic as needed.   YOU HAD AN ENDOSCOPIC PROCEDURE TODAY AT [facility]:   Refer to the procedure report that was given to you for any specific questions about what was found during the examination.  If the procedure report does not answer your questions, please call your gastroenterologist to clarify.  If you requested that your care partner not be given the details of your procedure findings, then the procedure report has been included in a sealed envelope for you to review at your convenience later.  YOU SHOULD EXPECT: Some feelings of bloating in the abdomen. Passage of more gas than usual.  Walking can help get rid of the air that was put into your GI tract during the procedure and reduce the bloating. If you had a lower endoscopy (such as a colonoscopy or flexible sigmoidoscopy) you may notice spotting of blood in your stool or on the toilet paper. If you underwent a bowel prep for your procedure, you may not have a normal bowel movement for a few days.  Please Note:  You might notice some irritation and congestion in your nose or some drainage.  This is from the oxygen used during your procedure.  There is no need for concern and it should clear up in a day or so.  SYMPTOMS TO REPORT IMMEDIATELY:  Following lower endoscopy (colonoscopy or flexible sigmoidoscopy):  Excessive amounts of blood in the stool  Significant tenderness or worsening of abdominal pains  Swelling of the abdomen that is new, acute  Fever of 100F or higher   For urgent or emergent issues, a gastroenterologist can be reached at any hour by calling [phone]. Do not use MyChart messaging for urgent concerns.    DIET:  We do recommend a small meal at first, but then you may proceed to your  regular diet.  Drink plenty of fluids but you should avoid alcoholic beverages for 24 hours.  ACTIVITY:  You should plan to take it easy for the rest of today and you should NOT DRIVE or use heavy machinery until tomorrow (because of the sedation medicines used during the test).    FOLLOW UP: Our staff will call the number listed on your records 48-72 hours following your procedure to check on you and address any questions or concerns that you may have regarding the information given to you following your procedure. If we do not reach you, we will leave a message.  We will attempt to reach you two times.  During this call, we will ask if you have developed any symptoms of COVID 19. If you develop any symptoms (ie: fever, flu-like symptoms, shortness of breath, cough etc.) before then, please call [phone].  If you test positive for Covid 19 in the 2 weeks post procedure, please call and report this information to [place].    If any biopsies were taken you will be contacted by phone or by letter within the next 1-3 weeks.  Please call us at [phone] if you have not heard about the biopsies in 3 weeks.    SIGNATURES/CONFIDENTIALITY: You and/or your care partner have signed paperwork which will be entered into your electronic medical record.  These signatures attest to the fact that that the information above on your After Visit Summary has been reviewed and is understood.  Full responsibility  of the confidentiality of this discharge information lies with you and/or your care-partner.

## 2020-12-23 NOTE — Progress Notes (Signed)
[facility] History and Physical   Primary Care Physician:  Tisovec, Fransico Him, MD   Reason for Procedure:  History of adenomatous colon polyps  Plan:    Surveillance colonoscopy with possible interventions as needed     HPI: Hailey Daniels is a very pleasant 75 y.o. female here for colonoscopy. Denies any nausea, vomiting, abdominal pain, melena or bright red blood per rectum  The risks and benefits as well as alternatives of endoscopic procedure(s) have been discussed and reviewed. All questions answered. The patient agrees to proceed.    Past Medical History:  Diagnosis Date   Barrett esophagus    Breast cancer ([facility]) 04/2020   Cancer ([facility]) 2006   tumor in stomach   Diverticulosis    Gastrointestinal stromal tumor of stomach ([facility]) 2006   no chemo/ in research study in 2006/ no meds now   GERD (gastroesophageal reflux disease)    on meds   Glaucoma    Hiatal hernia    History of radiation therapy 04/17/2020-05/14/2020   Left Breast; Dr. Gery Pray   Hypercholesteremia    Hypertension     Past Surgical History:  Procedure Laterality Date   BREAST BIOPSY Left    BREAST LUMPECTOMY WITH RADIOACTIVE SEED LOCALIZATION Left 03/13/2020   Procedure: LEFT BREAST LUMPECTOMY WITH RADIOACTIVE SEED LOCALIZATION;  Surgeon: Rolm Bookbinder, MD;  Location: [facility];  Service: General;  Laterality: Left;   COLONOSCOPY  07/07/2017   Dr.Meighan Treto   EXPLORATORY LAPAROTOMY  06/12/2004   resection of gastric tumor   STOMACH SURGERY     tear duct surgery     Bil/made a new opening in tear duct    Prior to Admission medications   Medication Sig Start Date End Date Taking? Authorizing Provider  atenolol (TENORMIN) 25 MG tablet Take 25 mg by mouth daily.   Yes [provider]  atorvastatin (LIPITOR) 20 MG tablet Take 1 tablet (20 mg total) by mouth daily. 06/04/15  Yes Larey Dresser, MD  Cholecalciferol (VITAMIN D-3) 1000 units CAPS Take  2,000 Units by mouth daily.   Yes [provider]  famotidine (PEPCID) 40 MG tablet TAKE ONE TABLET DAILY 05/03/20  Yes Ashawn Rinehart, Venia Minks, MD  hydrochlorothiazide (HYDRODIURIL) 25 MG tablet Take 25 mg by mouth daily.   Yes [provider]  magnesium oxide (MAG-OX) 400 (240 Mg) MG tablet 500mg   one tablet once daily 04/30/09  Yes [provider]  Multiple Vitamin (MULTIVITAMIN WITH MINERALS) TABS tablet Take 1 tablet by mouth daily.   Yes [provider]  Omega-3 Fatty Acids (FISH OIL) 1200 MG CPDR Take by mouth daily at 6 (six) AM.   Yes [provider]  omeprazole (PRILOSEC) 10 MG capsule Take 10 mg by mouth daily.   Yes [provider]  polycarbophil (FIBERCON) 625 MG tablet Take 625 mg by mouth daily. Take 2 pills daily   Yes [provider]  potassium chloride (K-DUR) 10 MEQ tablet Take 10 mEq by mouth daily.   Yes [provider]  timolol (TIMOPTIC) 0.5 % ophthalmic solution 1 drop 2 (two) times daily. 07/25/20  Yes [provider]    Current Outpatient Medications  Medication Sig Dispense Refill   atenolol (TENORMIN) 25 MG tablet Take 25 mg by mouth daily.     atorvastatin (LIPITOR) 20 MG tablet Take 1 tablet (20 mg total) by mouth daily. 30 tablet 6   Cholecalciferol (VITAMIN D-3) 1000 units CAPS Take 2,000 Units by mouth daily.  famotidine (PEPCID) 40 MG tablet TAKE ONE TABLET DAILY 90 tablet 3   hydrochlorothiazide (HYDRODIURIL) 25 MG tablet Take 25 mg by mouth daily.     magnesium oxide (MAG-OX) 400 (240 Mg) MG tablet 500mg   one tablet once daily     Multiple Vitamin (MULTIVITAMIN WITH MINERALS) TABS tablet Take 1 tablet by mouth daily.     Omega-3 Fatty Acids (FISH OIL) 1200 MG CPDR Take by mouth daily at 6 (six) AM.     omeprazole (PRILOSEC) 10 MG capsule Take 10 mg by mouth daily.     polycarbophil (FIBERCON) 625 MG tablet Take 625 mg by mouth daily. Take 2 pills daily     potassium chloride (K-DUR)  10 MEQ tablet Take 10 mEq by mouth daily.     timolol (TIMOPTIC) 0.5 % ophthalmic solution 1 drop 2 (two) times daily.     Current Facility-Administered Medications  Medication Dose Route Frequency Provider Last Rate Last Admin   0.9 %  sodium chloride infusion  500 mL Intravenous Once Mauri Pole, MD        Allergies as of 12/23/2020   (No Known Allergies)    Family History  Problem Relation Age of Onset   Lung cancer Mother    Other Father        brain tumor   Colon cancer Neg Hx    Pancreatic cancer Neg Hx    Stomach cancer Neg Hx    Rectal cancer Neg Hx    Esophageal cancer Neg Hx    Colon polyps Neg Hx     Social History   Socioeconomic History   Marital status: Married    Spouse name: Not on file   Number of children: 3   Years of education: Not on file   Highest education level: Not on file  Occupational History   Occupation: retired  Tobacco Use   Smoking status: Never   Smokeless tobacco: Never  Vaping Use   Vaping Use: Never used  Substance and Sexual Activity   Alcohol use: Yes    Alcohol/week: 7.0 standard drinks    Types: 7 Glasses of wine per week   Drug use: No   Sexual activity: Not on file  Other Topics Concern   Not on file  Social History Narrative   Not on file   Social Determinants of Health   Financial Resource Strain: Low Risk    Difficulty of Paying Living Expenses: Not hard at all  Food Insecurity: No Food Insecurity   Worried About Charity fundraiser in the Last Year: Never true   [place] in the Last Year: Never true  Transportation Needs: No Transportation Needs   Lack of Transportation (Medical): No   Lack of Transportation (Non-Medical): No  Physical Activity: Not on file  Stress: Not on file  Social Connections: Not on file  Intimate Partner Violence: Not on file    Review of Systems:  All other review of systems negative except as mentioned in the HPI.  Physical Exam: Vital signs in last 24  hours: BP (!) 184/107   Pulse 74   Temp 97.7 F (36.5 C) (Skin)   Ht 5\' 9"  (1.753 m)   Wt 146 lb (66.2 kg)   SpO2 96%   BMI 21.56 kg/m     General:   Alert, NAD Lungs:  Clear .   Heart:  Regular rate and rhythm Abdomen:  Soft, nontender and nondistended. Neuro/Psych:  Alert and cooperative. Normal  mood and affect. A and O x 3  Reviewed labs, radiology imaging, old records and pertinent past GI work up  Patient is appropriate for planned procedure(s) and anesthesia in an ambulatory setting   K. Denzil Magnuson , MD [phone]

## 2020-12-25 ENCOUNTER — Telehealth: Payer: Self-pay

## 2020-12-25 NOTE — Telephone Encounter (Signed)
  Follow up Call-  Call back number 12/23/2020 11/10/2018  Post procedure Call Back phone  # [phone]  Permission to leave phone message Yes Yes  Some recent data might be hidden     Patient questions:  Do you have a fever, pain , or abdominal swelling? No. Pain Score  0 *  Have you tolerated food without any problems? Yes.    Have you been able to return to your normal activities? Yes.    Do you have any questions about your discharge instructions: Diet   No. Medications  No. Follow up visit  No.  Do you have questions or concerns about your Care? No.  Actions: * If pain score is 4 or above: No action needed, pain <4. Have you developed a fever since your procedure? no  2.   Have you had an respiratory symptoms (SOB or cough) since your procedure? no  3.   Have you tested positive for COVID 19 since your procedure no  4.   Have you had any family members/close contacts diagnosed with the COVID 19 since your procedure?  no   If yes to any of these questions please route to Joylene John, RN and Joella Prince, RN

## 2020-12-31 ENCOUNTER — Encounter: Payer: Self-pay | Admitting: Gastroenterology

## 2021-01-01 DIAGNOSIS — Z124 Encounter for screening for malignant neoplasm of cervix: Secondary | ICD-10-CM | POA: Diagnosis not present

## 2021-01-01 DIAGNOSIS — Z01419 Encounter for gynecological examination (general) (routine) without abnormal findings: Secondary | ICD-10-CM | POA: Diagnosis not present

## 2021-01-01 DIAGNOSIS — Z01411 Encounter for gynecological examination (general) (routine) with abnormal findings: Secondary | ICD-10-CM | POA: Diagnosis not present

## 2021-01-01 DIAGNOSIS — Z853 Personal history of malignant neoplasm of breast: Secondary | ICD-10-CM | POA: Diagnosis not present

## 2021-01-01 DIAGNOSIS — Z6822 Body mass index (BMI) 22.0-22.9, adult: Secondary | ICD-10-CM | POA: Diagnosis not present

## 2021-01-01 DIAGNOSIS — N952 Postmenopausal atrophic vaginitis: Secondary | ICD-10-CM | POA: Diagnosis not present

## 2021-01-22 DIAGNOSIS — Z23 Encounter for immunization: Secondary | ICD-10-CM | POA: Diagnosis not present

## 2021-01-24 DIAGNOSIS — L309 Dermatitis, unspecified: Secondary | ICD-10-CM | POA: Diagnosis not present

## 2021-01-24 DIAGNOSIS — L308 Other specified dermatitis: Secondary | ICD-10-CM | POA: Diagnosis not present

## 2021-01-24 DIAGNOSIS — R21 Rash and other nonspecific skin eruption: Secondary | ICD-10-CM | POA: Diagnosis not present

## 2021-01-24 DIAGNOSIS — Z85828 Personal history of other malignant neoplasm of skin: Secondary | ICD-10-CM | POA: Diagnosis not present

## 2021-02-06 ENCOUNTER — Ambulatory Visit: Payer: Medicare Other | Admitting: Hematology and Oncology

## 2021-02-26 DIAGNOSIS — D1801 Hemangioma of skin and subcutaneous tissue: Secondary | ICD-10-CM | POA: Diagnosis not present

## 2021-02-26 DIAGNOSIS — Z85828 Personal history of other malignant neoplasm of skin: Secondary | ICD-10-CM | POA: Diagnosis not present

## 2021-02-26 DIAGNOSIS — D2272 Melanocytic nevi of left lower limb, including hip: Secondary | ICD-10-CM | POA: Diagnosis not present

## 2021-02-26 DIAGNOSIS — L570 Actinic keratosis: Secondary | ICD-10-CM | POA: Diagnosis not present

## 2021-02-26 DIAGNOSIS — L821 Other seborrheic keratosis: Secondary | ICD-10-CM | POA: Diagnosis not present

## 2021-03-12 DIAGNOSIS — H02052 Trichiasis without entropian right lower eyelid: Secondary | ICD-10-CM | POA: Diagnosis not present

## 2021-03-12 DIAGNOSIS — H02055 Trichiasis without entropian left lower eyelid: Secondary | ICD-10-CM | POA: Diagnosis not present

## 2021-03-12 DIAGNOSIS — H401131 Primary open-angle glaucoma, bilateral, mild stage: Secondary | ICD-10-CM | POA: Diagnosis not present

## 2021-05-07 DIAGNOSIS — I10 Essential (primary) hypertension: Secondary | ICD-10-CM | POA: Diagnosis not present

## 2021-05-07 DIAGNOSIS — R7301 Impaired fasting glucose: Secondary | ICD-10-CM | POA: Diagnosis not present

## 2021-05-07 DIAGNOSIS — E7800 Pure hypercholesterolemia, unspecified: Secondary | ICD-10-CM | POA: Diagnosis not present

## 2021-05-13 ENCOUNTER — Encounter (HOSPITAL_COMMUNITY): Payer: Self-pay

## 2021-05-15 DIAGNOSIS — R748 Abnormal levels of other serum enzymes: Secondary | ICD-10-CM | POA: Diagnosis not present

## 2021-05-15 DIAGNOSIS — R7301 Impaired fasting glucose: Secondary | ICD-10-CM | POA: Diagnosis not present

## 2021-05-15 DIAGNOSIS — Z1331 Encounter for screening for depression: Secondary | ICD-10-CM | POA: Diagnosis not present

## 2021-05-15 DIAGNOSIS — E876 Hypokalemia: Secondary | ICD-10-CM | POA: Diagnosis not present

## 2021-05-15 DIAGNOSIS — C50412 Malignant neoplasm of upper-outer quadrant of left female breast: Secondary | ICD-10-CM | POA: Diagnosis not present

## 2021-05-15 DIAGNOSIS — C49A2 Gastrointestinal stromal tumor of stomach: Secondary | ICD-10-CM | POA: Diagnosis not present

## 2021-05-15 DIAGNOSIS — Z1339 Encounter for screening examination for other mental health and behavioral disorders: Secondary | ICD-10-CM | POA: Diagnosis not present

## 2021-05-15 DIAGNOSIS — Z170 Estrogen receptor positive status [ER+]: Secondary | ICD-10-CM | POA: Diagnosis not present

## 2021-05-15 DIAGNOSIS — R82998 Other abnormal findings in urine: Secondary | ICD-10-CM | POA: Diagnosis not present

## 2021-05-15 DIAGNOSIS — I10 Essential (primary) hypertension: Secondary | ICD-10-CM | POA: Diagnosis not present

## 2021-05-15 DIAGNOSIS — F321 Major depressive disorder, single episode, moderate: Secondary | ICD-10-CM | POA: Diagnosis not present

## 2021-05-15 DIAGNOSIS — Z0000 Encounter for general adult medical examination without abnormal findings: Secondary | ICD-10-CM | POA: Diagnosis not present

## 2021-05-15 DIAGNOSIS — I447 Left bundle-branch block, unspecified: Secondary | ICD-10-CM | POA: Diagnosis not present

## 2021-05-30 DIAGNOSIS — H2512 Age-related nuclear cataract, left eye: Secondary | ICD-10-CM | POA: Diagnosis not present

## 2021-06-17 ENCOUNTER — Telehealth: Payer: Self-pay | Admitting: Gastroenterology

## 2021-06-17 MED ORDER — FAMOTIDINE 40 MG PO TABS: 40.0000 mg | ORAL_TABLET | Freq: Every day | ORAL | 3 refills | Status: DC

## 2021-06-17 NOTE — Telephone Encounter (Signed)
Left message for patient that her med was sent in  ?

## 2021-06-17 NOTE — Telephone Encounter (Signed)
Inbound call from patient requesting a medication Famotidine sent to [facility] ?

## 2021-06-19 DIAGNOSIS — H25812 Combined forms of age-related cataract, left eye: Secondary | ICD-10-CM | POA: Diagnosis not present

## 2021-06-19 DIAGNOSIS — H2512 Age-related nuclear cataract, left eye: Secondary | ICD-10-CM | POA: Diagnosis not present

## 2021-07-16 DIAGNOSIS — U071 COVID-19: Secondary | ICD-10-CM | POA: Diagnosis not present

## 2021-07-17 DIAGNOSIS — H2511 Age-related nuclear cataract, right eye: Secondary | ICD-10-CM | POA: Diagnosis not present

## 2021-07-17 DIAGNOSIS — H25811 Combined forms of age-related cataract, right eye: Secondary | ICD-10-CM | POA: Diagnosis not present

## 2021-07-23 DIAGNOSIS — L570 Actinic keratosis: Secondary | ICD-10-CM | POA: Diagnosis not present

## 2021-07-23 DIAGNOSIS — Z85828 Personal history of other malignant neoplasm of skin: Secondary | ICD-10-CM | POA: Diagnosis not present

## 2021-07-23 DIAGNOSIS — D1801 Hemangioma of skin and subcutaneous tissue: Secondary | ICD-10-CM | POA: Diagnosis not present

## 2021-07-23 DIAGNOSIS — L814 Other melanin hyperpigmentation: Secondary | ICD-10-CM | POA: Diagnosis not present

## 2021-07-23 DIAGNOSIS — L821 Other seborrheic keratosis: Secondary | ICD-10-CM | POA: Diagnosis not present

## 2021-09-19 DIAGNOSIS — H6123 Impacted cerumen, bilateral: Secondary | ICD-10-CM | POA: Diagnosis not present

## 2021-10-10 ENCOUNTER — Telehealth: Payer: Self-pay | Admitting: Hematology and Oncology

## 2021-10-10 NOTE — Telephone Encounter (Signed)
Per 7/14 phone line pt called to r/s appointment.  Appointment r/s per pt request

## 2021-10-13 ENCOUNTER — Inpatient Hospital Stay: Payer: Medicare Other | Admitting: Hematology and Oncology

## 2021-10-22 NOTE — Progress Notes (Signed)
Patient Care Team: Tisovec, Fransico Him, MD as PCP - General (Internal Medicine) Fanny Skates, MD (General Surgery) Ladell Pier, MD as Attending Physician (Hematology and Oncology) Lafayette Dragon, MD (Inactive) (Gastroenterology) Rockwell Germany, RN as Oncology Nurse Navigator Mauro Kaufmann, RN as Oncology Nurse Navigator Rolm Bookbinder, MD as Consulting Physician (General Surgery) Nicholas Lose, MD as Consulting Physician (Hematology and Oncology) Gery Pray, MD as Consulting Physician (Radiation Oncology)  DIAGNOSIS: No diagnosis found.  SUMMARY OF ONCOLOGIC HISTORY: Oncology History  Malignant neoplasm of upper-outer quadrant of left breast in female, estrogen receptor positive ([facility])  02/01/2020 Initial Diagnosis   Screening mammogram detected an architectural distortion in the left breast, 1.8cm at the 2 o'clock position. Biopsy showed invasive and in situ mammary carcinoma, grade 1-2, HER-2 equivocal by IHC, negative by FISH (ratio 1.28), ER/PR+ >95%, Ki67 5%.   02/07/2020 Cancer Staging   Staging form: Breast, AJCC 8th Edition - Clinical stage from 02/07/2020: Stage IA (cT1a, cN0, cM0, G2, ER+, PR+, HER2-)   03/13/2020 Surgery   Left lumpectomy Donne Hazel) [phone]): invasive and in situ ductal carcinoma, grade 1, 0.4cm, clear margins. No regional lymph nodes were examined.   04/27/2020 - 05/14/2020 Radiation Therapy   The patient initially received a dose of 40.05 Gy in 15 fractions to the breast using whole-breast tangent fields. This was delivered using a 3-D conformal technique. The pt received a boost delivering an additional 10 Gy in 5 fractions using a electron boost with 75mV electrons. The total dose was 50.05 Gy.   04/2020 - 05/2027 Anti-estrogen oral therapy   Anastrozole     CHIEF COMPLIANT:   INTERVAL HISTORY: Hailey BORREROis a   ALLERGIES:  has No Known Allergies.  MEDICATIONS:  Current Outpatient Medications  Medication Sig  Dispense Refill   atenolol (TENORMIN) 25 MG tablet Take 25 mg by mouth daily.     atorvastatin (LIPITOR) 20 MG tablet Take 1 tablet (20 mg total) by mouth daily. 30 tablet 6   Cholecalciferol (VITAMIN D-3) 1000 units CAPS Take 2,000 Units by mouth daily.     famotidine (PEPCID) 40 MG tablet Take 1 tablet (40 mg total) by mouth daily. 90 tablet 3   hydrochlorothiazide (HYDRODIURIL) 25 MG tablet Take 25 mg by mouth daily.     magnesium oxide (MAG-OX) 400 (240 Mg) MG tablet 5026m one tablet once daily     Multiple Vitamin (MULTIVITAMIN WITH MINERALS) TABS tablet Take 1 tablet by mouth daily.     Omega-3 Fatty Acids (FISH OIL) 1200 MG CPDR Take by mouth daily at 6 (six) AM.     omeprazole (PRILOSEC) 10 MG capsule Take 10 mg by mouth daily.     polycarbophil (FIBERCON) 625 MG tablet Take 625 mg by mouth daily. Take 2 pills daily     potassium chloride (K-DUR) 10 MEQ tablet Take 10 mEq by mouth daily.     timolol (TIMOPTIC) 0.5 % ophthalmic solution 1 drop 2 (two) times daily.     No current facility-administered medications for this visit.    PHYSICAL EXAMINATION: ECOG PERFORMANCE STATUS: {CHL ONC ECOG PS:[phone]}  There were no vitals filed for this visit. There were no vitals filed for this visit.  BREAST:*** No palpable masses or nodules in either right or left breasts. No palpable axillary supraclavicular or infraclavicular adenopathy no breast tenderness or nipple discharge. (exam performed in the presence of a chaperone)  LABORATORY DATA:  I have reviewed the data as listed  Latest Ref Rng & Units 03/07/2020    1:04 PM 02/07/2020    8:48 AM 06/27/2013    8:54 AM  CMP  Glucose 70 - 99 mg/dL 85  97    BUN 8 - 23 mg/dL 10  14    Creatinine 0.44 - 1.00 mg/dL 0.60  0.65    Sodium 135 - 145 mmol/L 138  143    Potassium 3.5 - 5.1 mmol/L 4.3  3.5    Chloride 98 - 111 mmol/L 100  102    CO2 22 - 32 mmol/L 27  31    Calcium 8.9 - 10.3 mg/dL 9.6  10.1    Total Protein 6.5 - 8.1  g/dL  7.4  7.2   Total Bilirubin 0.3 - 1.2 mg/dL  0.8  0.8   Alkaline Phos 38 - 126 U/L  60  51   AST 15 - 41 U/L  18  21   ALT 0 - 44 U/L  36  39     Lab Results  Component Value Date   WBC 5.1 02/07/2020   HGB 15.5 (H) 02/07/2020   HCT 45.0 02/07/2020   MCV 99.8 02/07/2020   PLT 216 02/07/2020   NEUTROABS 2.7 02/07/2020    ASSESSMENT & PLAN:  No problem-specific Assessment & Plan notes found for this encounter.    No orders of the defined types were placed in this encounter.  The patient has a good understanding of the overall plan. she agrees with it. she will call with any problems that may develop before the next visit here. Total time spent: 30 mins including face to face time and time spent for planning, charting and co-ordination of care   Suzzette Righter, [place] 10/22/21    I Gardiner Coins am scribing for Dr. Lindi Adie  ***

## 2021-10-23 ENCOUNTER — Inpatient Hospital Stay: Payer: Medicare Other | Attending: Hematology and Oncology | Admitting: Hematology and Oncology

## 2021-10-23 ENCOUNTER — Other Ambulatory Visit: Payer: Self-pay

## 2021-10-23 DIAGNOSIS — C50412 Malignant neoplasm of upper-outer quadrant of left female breast: Secondary | ICD-10-CM | POA: Diagnosis not present

## 2021-10-23 DIAGNOSIS — Z170 Estrogen receptor positive status [ER+]: Secondary | ICD-10-CM | POA: Diagnosis not present

## 2021-10-23 DIAGNOSIS — Z79899 Other long term (current) drug therapy: Secondary | ICD-10-CM | POA: Insufficient documentation

## 2021-10-23 DIAGNOSIS — N951 Menopausal and female climacteric states: Secondary | ICD-10-CM | POA: Diagnosis not present

## 2021-10-23 NOTE — Assessment & Plan Note (Signed)
03/13/2020:Left lumpectomy Hailey Daniels): invasive and in situ ductal carcinoma, grade 1, 0.4cm, clear marginsER/PR greater than 95%, Ki-67 5%, HER-2 negative by FISH ratio 1.28 T1 a N0 stage Ia  Treatment plan: 1.Adjuvant radiation therapy1/20/22- 05/14/20 2.followed by adjuvant antiestrogen therapy started February 2022-09/16/2020 discontinued because of hot flashes Bone density ordered at [place] [facility]:  1. Mammograms 11/06/2020 at [facility].  Benign, breast density category B 2. breast exam 10/23/2021: Benign  RTC in 1 year for follow-up

## 2021-10-27 DIAGNOSIS — D2221 Melanocytic nevi of right ear and external auricular canal: Secondary | ICD-10-CM | POA: Diagnosis not present

## 2021-10-27 DIAGNOSIS — D485 Neoplasm of uncertain behavior of skin: Secondary | ICD-10-CM | POA: Diagnosis not present

## 2021-10-27 DIAGNOSIS — D225 Melanocytic nevi of trunk: Secondary | ICD-10-CM | POA: Diagnosis not present

## 2021-10-27 DIAGNOSIS — L821 Other seborrheic keratosis: Secondary | ICD-10-CM | POA: Diagnosis not present

## 2021-10-27 DIAGNOSIS — D1801 Hemangioma of skin and subcutaneous tissue: Secondary | ICD-10-CM | POA: Diagnosis not present

## 2021-10-27 DIAGNOSIS — Z85828 Personal history of other malignant neoplasm of skin: Secondary | ICD-10-CM | POA: Diagnosis not present

## 2021-10-27 DIAGNOSIS — L570 Actinic keratosis: Secondary | ICD-10-CM | POA: Diagnosis not present

## 2021-10-27 DIAGNOSIS — D692 Other nonthrombocytopenic purpura: Secondary | ICD-10-CM | POA: Diagnosis not present

## 2021-10-27 DIAGNOSIS — C44712 Basal cell carcinoma of skin of right lower limb, including hip: Secondary | ICD-10-CM | POA: Diagnosis not present

## 2021-10-27 DIAGNOSIS — L300 Nummular dermatitis: Secondary | ICD-10-CM | POA: Diagnosis not present

## 2021-11-26 ENCOUNTER — Encounter: Payer: Self-pay | Admitting: Hematology and Oncology

## 2021-11-26 DIAGNOSIS — R928 Other abnormal and inconclusive findings on diagnostic imaging of breast: Secondary | ICD-10-CM | POA: Diagnosis not present

## 2021-11-26 DIAGNOSIS — Z853 Personal history of malignant neoplasm of breast: Secondary | ICD-10-CM | POA: Diagnosis not present

## 2022-01-15 DIAGNOSIS — Z23 Encounter for immunization: Secondary | ICD-10-CM | POA: Diagnosis not present

## 2022-02-27 DIAGNOSIS — L570 Actinic keratosis: Secondary | ICD-10-CM | POA: Diagnosis not present

## 2022-02-27 DIAGNOSIS — D2221 Melanocytic nevi of right ear and external auricular canal: Secondary | ICD-10-CM | POA: Diagnosis not present

## 2022-02-27 DIAGNOSIS — D1801 Hemangioma of skin and subcutaneous tissue: Secondary | ICD-10-CM | POA: Diagnosis not present

## 2022-02-27 DIAGNOSIS — Z85828 Personal history of other malignant neoplasm of skin: Secondary | ICD-10-CM | POA: Diagnosis not present

## 2022-02-27 DIAGNOSIS — L821 Other seborrheic keratosis: Secondary | ICD-10-CM | POA: Diagnosis not present

## 2022-04-28 DIAGNOSIS — D485 Neoplasm of uncertain behavior of skin: Secondary | ICD-10-CM | POA: Diagnosis not present

## 2022-04-28 DIAGNOSIS — L989 Disorder of the skin and subcutaneous tissue, unspecified: Secondary | ICD-10-CM | POA: Diagnosis not present

## 2022-04-28 DIAGNOSIS — D1801 Hemangioma of skin and subcutaneous tissue: Secondary | ICD-10-CM | POA: Diagnosis not present

## 2022-04-28 DIAGNOSIS — L300 Nummular dermatitis: Secondary | ICD-10-CM | POA: Diagnosis not present

## 2022-04-28 DIAGNOSIS — L820 Inflamed seborrheic keratosis: Secondary | ICD-10-CM | POA: Diagnosis not present

## 2022-04-28 DIAGNOSIS — L603 Nail dystrophy: Secondary | ICD-10-CM | POA: Diagnosis not present

## 2022-04-28 DIAGNOSIS — L821 Other seborrheic keratosis: Secondary | ICD-10-CM | POA: Diagnosis not present

## 2022-04-28 DIAGNOSIS — L570 Actinic keratosis: Secondary | ICD-10-CM | POA: Diagnosis not present

## 2022-04-28 DIAGNOSIS — Z85828 Personal history of other malignant neoplasm of skin: Secondary | ICD-10-CM | POA: Diagnosis not present

## 2022-05-21 DIAGNOSIS — R7989 Other specified abnormal findings of blood chemistry: Secondary | ICD-10-CM | POA: Diagnosis not present

## 2022-05-21 DIAGNOSIS — E7800 Pure hypercholesterolemia, unspecified: Secondary | ICD-10-CM | POA: Diagnosis not present

## 2022-05-21 DIAGNOSIS — R7301 Impaired fasting glucose: Secondary | ICD-10-CM | POA: Diagnosis not present

## 2022-05-21 DIAGNOSIS — I10 Essential (primary) hypertension: Secondary | ICD-10-CM | POA: Diagnosis not present

## 2022-05-21 DIAGNOSIS — K219 Gastro-esophageal reflux disease without esophagitis: Secondary | ICD-10-CM | POA: Diagnosis not present

## 2022-05-27 DIAGNOSIS — L988 Other specified disorders of the skin and subcutaneous tissue: Secondary | ICD-10-CM | POA: Diagnosis not present

## 2022-05-27 DIAGNOSIS — Z85828 Personal history of other malignant neoplasm of skin: Secondary | ICD-10-CM | POA: Diagnosis not present

## 2022-05-27 DIAGNOSIS — D485 Neoplasm of uncertain behavior of skin: Secondary | ICD-10-CM | POA: Diagnosis not present

## 2022-05-28 DIAGNOSIS — I447 Left bundle-branch block, unspecified: Secondary | ICD-10-CM | POA: Diagnosis not present

## 2022-05-28 DIAGNOSIS — F321 Major depressive disorder, single episode, moderate: Secondary | ICD-10-CM | POA: Diagnosis not present

## 2022-05-28 DIAGNOSIS — Z170 Estrogen receptor positive status [ER+]: Secondary | ICD-10-CM | POA: Diagnosis not present

## 2022-05-28 DIAGNOSIS — R7301 Impaired fasting glucose: Secondary | ICD-10-CM | POA: Diagnosis not present

## 2022-05-28 DIAGNOSIS — I10 Essential (primary) hypertension: Secondary | ICD-10-CM | POA: Diagnosis not present

## 2022-05-28 DIAGNOSIS — Z1339 Encounter for screening examination for other mental health and behavioral disorders: Secondary | ICD-10-CM | POA: Diagnosis not present

## 2022-05-28 DIAGNOSIS — Z79890 Hormone replacement therapy (postmenopausal): Secondary | ICD-10-CM | POA: Diagnosis not present

## 2022-05-28 DIAGNOSIS — C50412 Malignant neoplasm of upper-outer quadrant of left female breast: Secondary | ICD-10-CM | POA: Diagnosis not present

## 2022-05-28 DIAGNOSIS — C49A2 Gastrointestinal stromal tumor of stomach: Secondary | ICD-10-CM | POA: Diagnosis not present

## 2022-05-28 DIAGNOSIS — Z0000 Encounter for general adult medical examination without abnormal findings: Secondary | ICD-10-CM | POA: Diagnosis not present

## 2022-05-28 DIAGNOSIS — K219 Gastro-esophageal reflux disease without esophagitis: Secondary | ICD-10-CM | POA: Diagnosis not present

## 2022-05-28 DIAGNOSIS — Z1331 Encounter for screening for depression: Secondary | ICD-10-CM | POA: Diagnosis not present

## 2022-05-28 DIAGNOSIS — R82998 Other abnormal findings in urine: Secondary | ICD-10-CM | POA: Diagnosis not present

## 2022-06-17 ENCOUNTER — Other Ambulatory Visit: Payer: Self-pay | Admitting: Gastroenterology

## 2022-07-29 DIAGNOSIS — L570 Actinic keratosis: Secondary | ICD-10-CM | POA: Diagnosis not present

## 2022-07-29 DIAGNOSIS — C44729 Squamous cell carcinoma of skin of left lower limb, including hip: Secondary | ICD-10-CM | POA: Diagnosis not present

## 2022-07-29 DIAGNOSIS — Z85828 Personal history of other malignant neoplasm of skin: Secondary | ICD-10-CM | POA: Diagnosis not present

## 2022-07-29 DIAGNOSIS — D485 Neoplasm of uncertain behavior of skin: Secondary | ICD-10-CM | POA: Diagnosis not present

## 2022-07-29 DIAGNOSIS — L821 Other seborrheic keratosis: Secondary | ICD-10-CM | POA: Diagnosis not present

## 2022-07-29 DIAGNOSIS — D2371 Other benign neoplasm of skin of right lower limb, including hip: Secondary | ICD-10-CM | POA: Diagnosis not present

## 2022-08-25 DIAGNOSIS — Z961 Presence of intraocular lens: Secondary | ICD-10-CM | POA: Diagnosis not present

## 2022-08-25 DIAGNOSIS — H401131 Primary open-angle glaucoma, bilateral, mild stage: Secondary | ICD-10-CM | POA: Diagnosis not present

## 2022-08-25 DIAGNOSIS — H52203 Unspecified astigmatism, bilateral: Secondary | ICD-10-CM | POA: Diagnosis not present

## 2022-09-15 ENCOUNTER — Other Ambulatory Visit: Payer: Self-pay | Admitting: Gastroenterology

## 2022-09-15 NOTE — Telephone Encounter (Signed)
Patient needs an appointment to follow up for refills  of Pepcid

## 2022-09-18 MED ORDER — FAMOTIDINE 40 MG PO TABS: 40.0000 mg | ORAL_TABLET | Freq: Every day | ORAL | 0 refills | Status: DC

## 2022-09-18 NOTE — Telephone Encounter (Signed)
PT has scheduled an OV for 8/23 and needs a refill for pepcid sent to [facility] on [address]

## 2022-10-25 NOTE — Progress Notes (Signed)
Patient Care Team: Tisovec, Adelfa Koh, MD as PCP - General (Internal Medicine) Claud Kelp, MD (General Surgery) Ladene Artist, MD as Attending Physician (Hematology and Oncology) Hart Carwin, MD (Inactive) (Gastroenterology) Donnelly Angelica, RN as Oncology Nurse Navigator Pershing Proud, RN as Oncology Nurse Navigator Emelia Loron, MD as Consulting Physician (General Surgery) Serena Croissant, MD as Consulting Physician (Hematology and Oncology) Antony Blackbird, MD as Consulting Physician (Radiation Oncology)  DIAGNOSIS: No diagnosis found.  SUMMARY OF ONCOLOGIC HISTORY: Oncology History  Malignant neoplasm of upper-outer quadrant of left breast in female, estrogen receptor positive (HCC)  02/01/2020 Initial Diagnosis   Screening mammogram detected an architectural distortion in the left breast, 1.8cm at the 2 o'clock position. Biopsy showed invasive and in situ mammary carcinoma, grade 1-2, HER-2 equivocal by IHC, negative by FISH (ratio 1.28), ER/PR+ >95%, Ki67 5%.   02/07/2020 Cancer Staging   Staging form: Breast, AJCC 8th Edition - Clinical stage from 02/07/2020: Stage IA (cT1a, cN0, cM0, G2, ER+, PR+, HER2-)   03/13/2020 Surgery   Left lumpectomy [name]) [phone]): invasive and in situ ductal carcinoma, grade 1, 0.4cm, clear margins. No regional lymph nodes were examined.   04/27/2020 - 05/14/2020 Radiation Therapy   The patient initially received a dose of 40.05 Gy in 15 fractions to the breast using whole-breast tangent fields. This was delivered using a 3-D conformal technique. The pt received a boost delivering an additional 10 Gy in 5 fractions using a electron boost with electrons. The total dose was 50.05 Gy.   04/2020 - 05/2027 Anti-estrogen oral therapy   Anastrozole     CHIEF COMPLIANT: Follow-up for left breast cancer   INTERVAL HISTORY: Hailey Daniels is a 77 y.o. with above-mentioned history of left breast cancer who underwent a  left lumpectomy. She presents to the clinic for a follow-up.      ALLERGIES:  has No Known Allergies.  MEDICATIONS:  Current Outpatient Medications  Medication Sig Dispense Refill   atenolol (TENORMIN) 25 MG tablet Take 25 mg by mouth daily.     atorvastatin (LIPITOR) 20 MG tablet Take 1 tablet (20 mg total) by mouth daily. 30 tablet 6   Cholecalciferol (VITAMIN D-3) 1000 units CAPS Take 2,000 Units by mouth daily.     famotidine (PEPCID) 40 MG tablet Take 1 tablet (40 mg total) by mouth daily. 90 tablet 0   hydrochlorothiazide (HYDRODIURIL) 25 MG tablet Take 25 mg by mouth daily.     magnesium oxide (MAG-OX) 400 (240 Mg) MG tablet 500mg   one tablet once daily     Multiple Vitamin (MULTIVITAMIN WITH MINERALS) TABS tablet Take 1 tablet by mouth daily.     Omega-3 Fatty Acids (FISH OIL) 1200 MG CPDR Take by mouth daily at 6 (six) AM.     omeprazole (PRILOSEC) 10 MG capsule Take 10 mg by mouth daily.     polycarbophil (FIBERCON) 625 MG tablet Take 625 mg by mouth daily. Take 2 pills daily     potassium chloride (K-DUR) 10 MEQ tablet Take 10 mEq by mouth daily.     timolol (TIMOPTIC) 0.5 % ophthalmic solution 1 drop 2 (two) times daily.     No current facility-administered medications for this visit.    PHYSICAL EXAMINATION: ECOG PERFORMANCE STATUS: {CHL ONC ECOG PS:[id number]}  There were no vitals filed for this visit. There were no vitals filed for this visit.  BREAST:*** No palpable masses or nodules in either right or left breasts. No palpable axillary supraclavicular  or infraclavicular adenopathy no breast tenderness or nipple discharge. (exam performed in the presence of a chaperone)  LABORATORY DATA:  I have reviewed the data as listed    Latest Ref Rng & Units 03/07/2020    1:04 PM 02/07/2020    8:48 AM 06/27/2013    8:54 AM  CMP  Glucose 70 - 99 mg/dL 85  97    BUN 8 - 23 mg/dL 10  14    Creatinine 6.21 - 1.00 mg/dL 3.08  6.57    Sodium 846 - 145 mmol/L 138  143     Potassium 3.5 - 5.1 mmol/L 4.3  3.5    Chloride 98 - 111 mmol/L 100  102    CO2 22 - 32 mmol/L 27  31    Calcium 8.9 - 10.3 mg/dL 9.6  96.2    Total Protein 6.5 - 8.1 g/dL  7.4  7.2   Total Bilirubin 0.3 - 1.2 mg/dL  0.8  0.8   Alkaline Phos 38 - 126 U/L  60  51   AST 15 - 41 U/L  18  21   ALT 0 - 44 U/L  36  39     Lab Results  Component Value Date   WBC 5.1 02/07/2020   HGB 15.5 (H) 02/07/2020   HCT 45.0 02/07/2020   MCV 99.8 02/07/2020   PLT 216 02/07/2020   NEUTROABS 2.7 02/07/2020    ASSESSMENT & PLAN:  No problem-specific Assessment & Plan notes found for this encounter.    No orders of the defined types were placed in this encounter.  The patient has a good understanding of the overall plan. she agrees with it. she will call with any problems that may develop before the next visit here. Total time spent: 30 mins including face to face time and time spent for planning, charting and co-ordination of care   Sherlyn Lick, CMA 10/25/22    I Janan Ridge am acting as a Neurosurgeon for [employer]  ***

## 2022-10-26 ENCOUNTER — Inpatient Hospital Stay: Payer: Medicare Other | Attending: Hematology and Oncology | Admitting: Hematology and Oncology

## 2022-10-26 VITALS — BP 183/90 | HR 66 | Temp 97.4°F | Resp 18 | Ht 69.0 in | Wt 139.2 lb

## 2022-10-26 DIAGNOSIS — Z79811 Long term (current) use of aromatase inhibitors: Secondary | ICD-10-CM | POA: Diagnosis not present

## 2022-10-26 DIAGNOSIS — R232 Flushing: Secondary | ICD-10-CM | POA: Diagnosis not present

## 2022-10-26 DIAGNOSIS — C50412 Malignant neoplasm of upper-outer quadrant of left female breast: Secondary | ICD-10-CM

## 2022-10-26 DIAGNOSIS — Z170 Estrogen receptor positive status [ER+]: Secondary | ICD-10-CM | POA: Diagnosis not present

## 2022-10-26 NOTE — Assessment & Plan Note (Addendum)
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  Adjuvant radiation therapy 04/18/20- 05/14/20 2. followed by adjuvant antiestrogen therapy started February 2022-09/16/2020 discontinued because of hot flashes Bone density ordered at [facility]   Breast Cancer Surveillance:  1. Mammograms 11/26/2021 at [facility].  Benign, breast density category B 2. breast exam 10/26/2022: Benign   Hot flashes: Mild to moderate continuing in spite of not being on hormone therapy.  RTC in 6 months and after that we can see her once a year for follow-ups.

## 2022-11-06 DIAGNOSIS — L570 Actinic keratosis: Secondary | ICD-10-CM | POA: Diagnosis not present

## 2022-11-06 DIAGNOSIS — L821 Other seborrheic keratosis: Secondary | ICD-10-CM | POA: Diagnosis not present

## 2022-11-06 DIAGNOSIS — L300 Nummular dermatitis: Secondary | ICD-10-CM | POA: Diagnosis not present

## 2022-11-06 DIAGNOSIS — Z85828 Personal history of other malignant neoplasm of skin: Secondary | ICD-10-CM | POA: Diagnosis not present

## 2022-11-19 ENCOUNTER — Other Ambulatory Visit: Payer: Self-pay | Admitting: Gastroenterology

## 2022-11-20 ENCOUNTER — Ambulatory Visit (INDEPENDENT_AMBULATORY_CARE_PROVIDER_SITE_OTHER): Payer: Medicare Other | Admitting: Gastroenterology

## 2022-11-20 ENCOUNTER — Encounter: Payer: Self-pay | Admitting: Gastroenterology

## 2022-11-20 VITALS — BP 130/80 | HR 60 | Ht 67.0 in | Wt 136.4 lb

## 2022-11-20 DIAGNOSIS — K219 Gastro-esophageal reflux disease without esophagitis: Secondary | ICD-10-CM

## 2022-11-20 MED ORDER — FAMOTIDINE 40 MG PO TABS: 40.0000 mg | ORAL_TABLET | Freq: Every day | ORAL | 3 refills | Status: DC

## 2022-11-20 NOTE — Patient Instructions (Signed)
We have sent the following medications to your pharmacy for you to pick up at your convenience: Pepcid  Call us back if you need a Omeprazole refill  Follow up in 1 year  _______________________________________________________  If your blood pressure at your visit was 140/90 or greater, please contact your primary care physician to follow up on this.  _______________________________________________________  If you are age 76 or older, your body mass index should be between 23-30. Your Body mass index is 21.36 kg/m. If this is out of the aforementioned range listed, please consider follow up with your Primary Care Provider.  If you are age 47 or younger, your body mass index should be between 19-25. Your Body mass index is 21.36 kg/m. If this is out of the aformentioned range listed, please consider follow up with your Primary Care Provider.   ________________________________________________________  The [facility] providers would like to encourage you to use [facility] to communicate with providers for non-urgent requests or questions.  Due to long hold times on the telephone, sending your provider a message by [facility] may be a faster and more efficient way to get a response.  Please allow 48 business hours for a response.  Please remember that this is for non-urgent requests.  _______________________________________________________   I appreciate the  opportunity to care for you  Thank You   [facility]

## 2022-11-20 NOTE — Progress Notes (Signed)
Chief Complaint: Med refill Primary GI MD: Dr. Lavon Paganini  HPI: 77 year old female, history of gastrointestinal stromal tumor diagnosed March 2006 and resected by Dr. Derrell Lolling, presents for medication refill.  Last seen in 2017 by Dr. Lavon Paganini Patient completed adjuvant chemotherapy and has been in remission.  Follows with Dr. Truett Perna.  EGD in 2006 showed irregular Z-line with biopsies positive for Barrett's esophagus.  No Barrett's on 2008 and 2010 endoscopy.  At the time of her last office visit her symptoms were well-controlled on H2 blocker Pepcid at bedtime.    Patient states she is doing well and GERD is well controlled on omeprazole 10mg  and pepcid at bedtime. No complaints today. Denies melena/hematochezia. Denies weight loss.   PREVIOUS GI WORKUP   Colonoscopy 11/2020 for history of polyps - Four 3 to 8 mm tubular adenomas in the ascending colon, removed with a cold snare. Resected and retrieved.  - One 12 mm tubular adenoma in the transverse colon, removed with a hot snare. Resected and retrieved.  - Diverticulosis in the sigmoid colon and in the descending colon.  - Non- bleeding external and internal hemorrhoids. Due for repeat 11/2023  EGD 11/10/2018 for surveillance of Barrett's esophagus - LA Grade B reflux esophagitis.  - Medium- sized hiatal hernia.  - Post- surgical deformity in the anterior wall of the stomach.  - Three gastric polyps. Resected and retrieved.  - Normal examined duodenum.  - The examination was otherwise normal. Biopsy showed fundic gland polyps.  No evidence of H. pylori or intestinal metaplasia  Colonoscopy 06/2017 for screening - One 14 mm tubular adenoma in the transverse colon, removed with a hot snare. Resected and retrieved.  - Severe diverticulosis in the sigmoid colon and in the descending colon. There was narrowing of the colon in association with the diverticular opening. There was evidence of diverticularspasm. Peri- diverticular erythema  was seen.  - Friability with contact bleeding in the ascending colon and in the cecum.  - Non- bleeding internal hemorrhoids.  - The examination was otherwise normal.  EGD 09/2013 for surveillance - Grade 1 esophagitis - Small hiatal hernia - S/p remote resection of gastric stromal tumor with no evidence of recurrence.  Past Medical History:  Diagnosis Date   Barrett esophagus    Breast cancer (HCC) 04/2020   Cancer (HCC) 2006   tumor in stomach   Diverticulosis    Gastrointestinal stromal tumor of stomach (HCC) 2006   no chemo/ in research study in 2006/ no meds now   GERD (gastroesophageal reflux disease)    on meds   Glaucoma    Hiatal hernia    History of radiation therapy 04/17/2020-05/14/2020   Left Breast; Dr. Antony Blackbird   Hypercholesteremia    Hypertension     Past Surgical History:  Procedure Laterality Date   BREAST BIOPSY Left    BREAST LUMPECTOMY WITH RADIOACTIVE SEED LOCALIZATION Left 03/13/2020   Procedure: LEFT BREAST LUMPECTOMY WITH RADIOACTIVE SEED LOCALIZATION;  Surgeon: Emelia Loron, MD;  Location: [facility];  Service: General;  Laterality: Left;   COLONOSCOPY  07/07/2017   Dr.[email or web address]   EXPLORATORY LAPAROTOMY  06/12/2004   resection of gastric tumor   STOMACH SURGERY     tear duct surgery     Bil/made a new opening in tear duct    Current Outpatient Medications  Medication Sig Dispense Refill   atenolol (TENORMIN) 25 MG tablet Take 25 mg by mouth daily.     atorvastatin (LIPITOR) 20 MG tablet Take  1 tablet (20 mg total) by mouth daily. 30 tablet 6   Cholecalciferol (VITAMIN D-3) 1000 units CAPS Take 2,000 Units by mouth daily.     famotidine (PEPCID) 40 MG tablet TAKE 1 TABLET BY MOUTH EVERY DAY 90 tablet 0   hydrochlorothiazide (HYDRODIURIL) 25 MG tablet Take 25 mg by mouth daily.     magnesium oxide (MAG-OX) 400 (240 Mg) MG tablet 500mg   one tablet once daily     Multiple Vitamin (MULTIVITAMIN WITH MINERALS) TABS tablet  Take 1 tablet by mouth daily.     Omega-3 Fatty Acids (FISH OIL) 1200 MG CPDR Take by mouth daily at 6 (six) AM.     omeprazole (PRILOSEC) 10 MG capsule Take 10 mg by mouth daily.     polycarbophil (FIBERCON) 625 MG tablet Take 625 mg by mouth daily. Take 2 pills daily     potassium chloride (K-DUR) 10 MEQ tablet Take 10 mEq by mouth daily.     timolol (TIMOPTIC) 0.5 % ophthalmic solution 1 drop 2 (two) times daily.     No current facility-administered medications for this visit.    Allergies as of 11/20/2022   (No Known Allergies)    Family History  Problem Relation Age of Onset   Lung cancer Mother    Other Father        brain tumor   Colon cancer Neg Hx    Pancreatic cancer Neg Hx    Stomach cancer Neg Hx    Rectal cancer Neg Hx    Esophageal cancer Neg Hx    Colon polyps Neg Hx     Social History   Socioeconomic History   Marital status: Married    Spouse name: Not on file   Number of children: 3   Years of education: Not on file   Highest education level: Not on file  Occupational History   Occupation: retired  Tobacco Use   Smoking status: Never   Smokeless tobacco: Never  Vaping Use   Vaping status: Never Used  Substance and Sexual Activity   Alcohol use: Yes    Alcohol/week: 7.0 standard drinks of alcohol    Types: 7 Glasses of wine per week   Drug use: No   Sexual activity: Not on file  Other Topics Concern   Not on file  Social History Narrative   Not on file   Social Determinants of Health   Financial Resource Strain: Low Risk  (02/07/2020)   Overall Financial Resource Strain (CARDIA)    Difficulty of Paying Living Expenses: Not hard at all  Food Insecurity: No Food Insecurity (02/07/2020)   Hunger Vital Sign    Worried About Running Out of Food in the Last Year: Never true    Ran Out of Food in the Last Year: Never true  Transportation Needs: No Transportation Needs (02/07/2020)   PRAPARE - Administrator, Civil Service (Medical):  No    Lack of Transportation (Non-Medical): No  Physical Activity: Not on file  Stress: Not on file  Social Connections: Not on file  Intimate Partner Violence: Not on file    Review of Systems:    Constitutional: No weight loss, fever, chills, weakness or fatigue HEENT: Eyes: No change in vision               Ears, Nose, Throat:  No change in hearing or congestion Skin: No rash or itching Cardiovascular: No chest pain, chest pressure or palpitations   Respiratory: No SOB or  cough Gastrointestinal: See HPI and otherwise negative Genitourinary: No dysuria or change in urinary frequency Neurological: No headache, dizziness or syncope Musculoskeletal: No new muscle or joint pain Hematologic: No bleeding or bruising Psychiatric: No history of depression or anxiety    Physical Exam:  Vital signs: There were no vitals taken for this visit.  Constitutional: NAD, Well developed, Well nourished, alert and cooperative Head:  Normocephalic and atraumatic. Eyes:   PEERL, EOMI. No icterus. Conjunctiva pink. Respiratory: Respirations even and unlabored. Lungs clear to auscultation bilaterally.   No wheezes, crackles, or rhonchi.  Cardiovascular:  Regular rate and rhythm. No peripheral edema, cyanosis or pallor.  Gastrointestinal:  Soft, nondistended, nontender. No rebound or guarding. Normal bowel sounds. No appreciable masses or hepatomegaly. Rectal:  Not performed.  Msk:  Symmetrical without gross deformities. Without edema, no deformity or joint abnormality.  Neurologic:  Alert and  oriented x4;  grossly normal neurologically.  Skin:   Dry and intact without significant lesions or rashes. Psychiatric: Oriented to person, place and time. Demonstrates good judgement and reason without abnormal affect or behaviors.   RELEVANT LABS AND IMAGING: CBC    Component Value Date/Time   WBC 5.1 02/07/2020 0848   WBC 6.4 05/12/2010 1238   RBC 4.51 02/07/2020 0848   HGB 15.5 (H) 02/07/2020 0848    HGB 14.0 03/08/2007 1342   HCT 45.0 02/07/2020 0848   HCT 40.3 03/08/2007 1342   PLT 216 02/07/2020 0848   PLT 327 03/08/2007 1342   MCV 99.8 02/07/2020 0848   MCV 94.6 03/08/2007 1342   MCH 34.4 (H) 02/07/2020 0848   MCHC 34.4 02/07/2020 0848   RDW 11.4 (L) 02/07/2020 0848   RDW 12.5 03/08/2007 1342   LYMPHSABS 1.7 02/07/2020 0848   LYMPHSABS 1.8 03/08/2007 1342   MONOABS 0.4 02/07/2020 0848   MONOABS 0.3 03/08/2007 1342   EOSABS 0.2 02/07/2020 0848   EOSABS 0.2 03/08/2007 1342   BASOSABS 0.1 02/07/2020 0848   BASOSABS 0.0 03/08/2007 1342    CMP     Component Value Date/Time   NA 138 03/07/2020 1304   K 4.3 03/07/2020 1304   CL 100 03/07/2020 1304   CO2 27 03/07/2020 1304   GLUCOSE 85 03/07/2020 1304   BUN 10 03/07/2020 1304   CREATININE 0.60 03/07/2020 1304   CREATININE 0.65 02/07/2020 0848   CALCIUM 9.6 03/07/2020 1304   PROT 7.4 02/07/2020 0848   ALBUMIN 3.9 02/07/2020 0848   AST 18 02/07/2020 0848   ALT 36 02/07/2020 0848   ALKPHOS 60 02/07/2020 0848   BILITOT 0.8 02/07/2020 0848   GFRNONAA >60 03/07/2020 1304   GFRNONAA >60 02/07/2020 0848   GFRAA  03/08/2007 0741    >60        The eGFR has been calculated using the MDRD equation. This calculation has not been validated in all clinical     Assessment/Plan:   GERD History of Barretts EGD in 2020 with no evidence of barrett's.  Symptoms currently well-controlled on omeprazole and Pepcid.  No repeat noted for EGD. --- Antireflux measures provided --- Can refill omeprazole and Pepcid  History of colon polyps --- Due for repeat 2025  Lara Mulch [facility] 11/20/2022, 8:24 AM  Cc: Gaspar Garbe, MD

## 2022-12-02 DIAGNOSIS — Z1231 Encounter for screening mammogram for malignant neoplasm of breast: Secondary | ICD-10-CM | POA: Diagnosis not present

## 2022-12-02 LAB — HM MAMMOGRAPHY

## 2022-12-28 ENCOUNTER — Encounter: Payer: Self-pay | Admitting: Hematology and Oncology

## 2023-01-13 DIAGNOSIS — R0683 Snoring: Secondary | ICD-10-CM | POA: Diagnosis not present

## 2023-01-13 DIAGNOSIS — F458 Other somatoform disorders: Secondary | ICD-10-CM | POA: Diagnosis not present

## 2023-02-01 DIAGNOSIS — R0683 Snoring: Secondary | ICD-10-CM | POA: Diagnosis not present

## 2023-02-01 DIAGNOSIS — G4710 Hypersomnia, unspecified: Secondary | ICD-10-CM | POA: Diagnosis not present

## 2023-02-09 DIAGNOSIS — Z85828 Personal history of other malignant neoplasm of skin: Secondary | ICD-10-CM | POA: Diagnosis not present

## 2023-02-09 DIAGNOSIS — L300 Nummular dermatitis: Secondary | ICD-10-CM | POA: Diagnosis not present

## 2023-02-09 DIAGNOSIS — L821 Other seborrheic keratosis: Secondary | ICD-10-CM | POA: Diagnosis not present

## 2023-02-09 DIAGNOSIS — L570 Actinic keratosis: Secondary | ICD-10-CM | POA: Diagnosis not present

## 2023-02-17 DIAGNOSIS — Z23 Encounter for immunization: Secondary | ICD-10-CM | POA: Diagnosis not present

## 2023-03-05 DIAGNOSIS — H401131 Primary open-angle glaucoma, bilateral, mild stage: Secondary | ICD-10-CM | POA: Diagnosis not present

## 2023-04-28 ENCOUNTER — Inpatient Hospital Stay: Payer: Medicare Other | Attending: Hematology and Oncology | Admitting: Hematology and Oncology

## 2023-04-28 VITALS — BP 127/68 | HR 66 | Temp 97.2°F | Resp 18 | Ht 67.0 in | Wt 140.9 lb

## 2023-04-28 DIAGNOSIS — Z79811 Long term (current) use of aromatase inhibitors: Secondary | ICD-10-CM | POA: Diagnosis not present

## 2023-04-28 DIAGNOSIS — Z923 Personal history of irradiation: Secondary | ICD-10-CM | POA: Insufficient documentation

## 2023-04-28 DIAGNOSIS — Z1721 Progesterone receptor positive status: Secondary | ICD-10-CM | POA: Diagnosis not present

## 2023-04-28 DIAGNOSIS — R232 Flushing: Secondary | ICD-10-CM | POA: Insufficient documentation

## 2023-04-28 DIAGNOSIS — C50412 Malignant neoplasm of upper-outer quadrant of left female breast: Secondary | ICD-10-CM | POA: Insufficient documentation

## 2023-04-28 DIAGNOSIS — Z170 Estrogen receptor positive status [ER+]: Secondary | ICD-10-CM | POA: Insufficient documentation

## 2023-04-28 DIAGNOSIS — Z1732 Human epidermal growth factor receptor 2 negative status: Secondary | ICD-10-CM | POA: Insufficient documentation

## 2023-04-28 NOTE — Progress Notes (Signed)
Patient Care Team: Tisovec, Adelfa Koh, MD as PCP - General (Internal Medicine) Claud Kelp, MD (General Surgery) Ladene Artist, MD as Attending Physician (Hematology and Oncology) Hart Carwin, MD (Inactive) (Gastroenterology) Donnelly Angelica, RN as Oncology Nurse Navigator Pershing Proud, RN as Oncology Nurse Navigator Emelia Loron, MD as Consulting Physician (General Surgery) Serena Croissant, MD as Consulting Physician (Hematology and Oncology) Antony Blackbird, MD as Consulting Physician (Radiation Oncology)  DIAGNOSIS:  Encounter Diagnosis  Name Primary?   Malignant neoplasm of upper-outer quadrant of left breast in female, estrogen receptor positive (HCC) Yes    SUMMARY OF ONCOLOGIC HISTORY: Oncology History  Malignant neoplasm of upper-outer quadrant of left breast in female, estrogen receptor positive (HCC)  02/01/2020 Initial Diagnosis   Screening mammogram detected an architectural distortion in the left breast, 1.8cm at the 2 o'clock position. Biopsy showed invasive and in situ mammary carcinoma, grade 1-2, HER-2 equivocal by IHC, negative by FISH (ratio 1.28), ER/PR+ >95%, Ki67 5%.   02/07/2020 Cancer Staging   Staging form: Breast, AJCC 8th Edition - Clinical stage from 02/07/2020: Stage IA (cT1a, cN0, cM0, G2, ER+, PR+, HER2-)   03/13/2020 Surgery   Left lumpectomy [name]) [phone]): invasive and in situ ductal carcinoma, grade 1, 0.4cm, clear margins. No regional lymph nodes were examined.   04/27/2020 - 05/14/2020 Radiation Therapy   The patient initially received a dose of 40.05 Gy in 15 fractions to the breast using whole-breast tangent fields. This was delivered using a 3-D conformal technique. The pt received a boost delivering an additional 10 Gy in 5 fractions using a electron boost with electrons. The total dose was 50.05 Gy.   04/2020 - 05/2027 Anti-estrogen oral therapy   Anastrozole     CHIEF COMPLIANT: Follow-up and  surveillance of breast cancer  HISTORY OF PRESENT ILLNESS:   History of Present Illness   The patient presents for a follow-up visit regarding ongoing hot flashes.  She continues to experience hot flashes that have persisted for over three years. She describes them as less overwhelming than before, previously feeling 'kind of like constant.'  Her mamm[facility] at [facility] have been normal, indicating no issues in breast cancer surveillance. She is over three years post-diagnosis and continues with regular monitoring.  She inquired about a coronary health screening related to coronary calcium scoring offered during her mammogram, though she did not pursue it.  She maintains an active lifestyle, attempting to walk daily despite weather challenges. She recently completed a ten-day hike to the [facility] base [place], which required six months of training.         ALLERGIES:  has no known allergies.  MEDICATIONS:  Current Outpatient Medications  Medication Sig Dispense Refill   atenolol (TENORMIN) 25 MG tablet Take 25 mg by mouth daily.     atorvastatin (LIPITOR) 40 MG tablet Take 40 mg by mouth daily.     Cholecalciferol (VITAMIN D-3) 1000 units CAPS Take 2,000 Units by mouth daily.     famotidine (PEPCID) 40 MG tablet Take 1 tablet (40 mg total) by mouth daily. 90 tablet 3   hydrochlorothiazide (HYDRODIURIL) 25 MG tablet Take 25 mg by mouth daily.     magnesium oxide (MAG-OX) 400 (240 Mg) MG tablet 500mg   one tablet once daily     Multiple Vitamin (MULTIVITAMIN WITH MINERALS) TABS tablet Take 1 tablet by mouth daily.     Omega-3 Fatty Acids (FISH OIL) 1200 MG CPDR Take by mouth daily at 6 (six) AM.  omeprazole (PRILOSEC) 10 MG capsule Take 10 mg by mouth daily.     polycarbophil (FIBERCON) 625 MG tablet Take 625 mg by mouth daily. Take 2 pills daily     potassium chloride (K-DUR) 10 MEQ tablet Take 10 mEq by mouth daily.     timolol (TIMOPTIC) 0.5 % ophthalmic solution 1 drop 2 (two)  times daily.     traZODone (DESYREL) 50 MG tablet Take 50 mg by mouth at bedtime as needed.     triamcinolone cream (KENALOG) 0.1 % Apply 1 Application topically 2 (two) times daily.     No current facility-administered medications for this visit.    PHYSICAL EXAMINATION: ECOG PERFORMANCE STATUS: 1 - Symptomatic but completely ambulatory  Vitals:   04/28/23 0907  BP: 127/68  Pulse: 66  Resp: 18  Temp: (!) 97.2 F (36.2 C)  SpO2: 99%   Filed Weights   04/28/23 0907  Weight: 140 lb 14.4 oz (63.9 kg)    Physical Exam   BREAST: Normal findings.      (exam performed in the presence of a chaperone)  LABORATORY DATA:  I have reviewed the data as listed    Latest Ref Rng & Units 03/07/2020    1:04 PM 02/07/2020    8:48 AM 06/27/2013    8:54 AM  CMP  Glucose 70 - 99 mg/dL 85  97    BUN 8 - 23 mg/dL 10  14    Creatinine 8.11 - 1.00 mg/dL 9.14  7.82    Sodium 956 - 145 mmol/L 138  143    Potassium 3.5 - 5.1 mmol/L 4.3  3.5    Chloride 98 - 111 mmol/L 100  102    CO2 22 - 32 mmol/L 27  31    Calcium 8.9 - 10.3 mg/dL 9.6  21.3    Total Protein 6.5 - 8.1 g/dL  7.4  7.2   Total Bilirubin 0.3 - 1.2 mg/dL  0.8  0.8   Alkaline Phos 38 - 126 U/L  60  51   AST 15 - 41 U/L  18  21   ALT 0 - 44 U/L  36  39     Lab Results  Component Value Date   WBC 5.1 02/07/2020   HGB 15.5 (H) 02/07/2020   HCT 45.0 02/07/2020   MCV 99.8 02/07/2020   PLT 216 02/07/2020   NEUTROABS 2.7 02/07/2020    ASSESSMENT & PLAN:  Malignant neoplasm of upper-outer quadrant of left breast in female, estrogen receptor positive (HCC) 03/13/2020:Left lumpectomy [name]): invasive and in situ ductal carcinoma, grade 1, 0.4cm, clear margins ER/PR greater than 95%, Ki-67 5%, HER-2 negative by FISH ratio 1.28 T1 a N0 stage Ia   Treatment plan: 1.  Adjuvant radiation therapy 04/18/20- 05/14/20 2. followed by adjuvant antiestrogen therapy started February 2022-09/16/2020 discontinued because of hot  flashes Bone density ordered at [facility]   Breast Cancer Surveillance:  1. Mammograms 12/02/2022 at [place].  Benign, breast density category B 2. breast exam 04/28/23: Benign   Hot flashes: Mild to moderate continuing in spite of not being on hormone therapy.   RTC in 1 year for follow-ups. ------------------------------------- Assessment and Plan    Breast Cancer Follow-Up Over three years post-diagnosis of breast cancer. Mammograms at [facility] have been consistently normal. No new symptoms or concerns related to breast cancer were reported. - Continue annual mammograms for the next two years - Transition follow-up care to primary care physician after five years  Hot Flashes Experiencing  less severe and intermittent hot flashes, common in postmenopausal individuals and can persist for many years. - Monitor symptoms - Discuss management options if symptoms worsen  Follow-up - Schedule next follow-up appointment - Direct to schedulers for appointment arrangements.          No orders of the defined types were placed in this encounter.  The patient has a good understanding of the overall plan. she agrees with it. she will call with any problems that may develop before the next visit here. Total time spent: 30 mins including face to face time and time spent for planning, charting and co-ordination of care   Tamsen Meek, MD 04/28/23

## 2023-04-28 NOTE — Assessment & Plan Note (Signed)
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  Adjuvant radiation therapy 04/18/20- 05/14/20 2. followed by adjuvant antiestrogen therapy started February 2022-09/16/2020 discontinued because of hot flashes Bone density ordered at [facility]   Breast Cancer Surveillance:  1. Mammograms 12/02/2022 at [place].  Benign, breast density category B 2. breast exam 04/28/23: Benign   Hot flashes: Mild to moderate continuing in spite of not being on hormone therapy.   RTC in 1 year for follow-ups.

## 2023-04-29 ENCOUNTER — Telehealth: Payer: Self-pay | Admitting: Hematology and Oncology

## 2023-04-29 NOTE — Telephone Encounter (Signed)
Patient called to confirm scheduled appointments. Confirm 02/3 appointment with patient.

## 2023-05-12 DIAGNOSIS — L821 Other seborrheic keratosis: Secondary | ICD-10-CM | POA: Diagnosis not present

## 2023-05-12 DIAGNOSIS — L570 Actinic keratosis: Secondary | ICD-10-CM | POA: Diagnosis not present

## 2023-05-12 DIAGNOSIS — L300 Nummular dermatitis: Secondary | ICD-10-CM | POA: Diagnosis not present

## 2023-05-12 DIAGNOSIS — Z85828 Personal history of other malignant neoplasm of skin: Secondary | ICD-10-CM | POA: Diagnosis not present

## 2023-05-12 DIAGNOSIS — D1801 Hemangioma of skin and subcutaneous tissue: Secondary | ICD-10-CM | POA: Diagnosis not present

## 2023-06-02 DIAGNOSIS — R7301 Impaired fasting glucose: Secondary | ICD-10-CM | POA: Diagnosis not present

## 2023-06-02 DIAGNOSIS — I10 Essential (primary) hypertension: Secondary | ICD-10-CM | POA: Diagnosis not present

## 2023-06-02 DIAGNOSIS — Z1212 Encounter for screening for malignant neoplasm of rectum: Secondary | ICD-10-CM | POA: Diagnosis not present

## 2023-06-02 DIAGNOSIS — E7800 Pure hypercholesterolemia, unspecified: Secondary | ICD-10-CM | POA: Diagnosis not present

## 2023-06-02 DIAGNOSIS — E781 Pure hyperglyceridemia: Secondary | ICD-10-CM | POA: Diagnosis not present

## 2023-06-08 ENCOUNTER — Other Ambulatory Visit: Payer: Self-pay | Admitting: Gastroenterology

## 2023-06-09 DIAGNOSIS — Z0000 Encounter for general adult medical examination without abnormal findings: Secondary | ICD-10-CM | POA: Diagnosis not present

## 2023-06-09 DIAGNOSIS — C49A2 Gastrointestinal stromal tumor of stomach: Secondary | ICD-10-CM | POA: Diagnosis not present

## 2023-06-09 DIAGNOSIS — H409 Unspecified glaucoma: Secondary | ICD-10-CM | POA: Diagnosis not present

## 2023-06-09 DIAGNOSIS — E7800 Pure hypercholesterolemia, unspecified: Secondary | ICD-10-CM | POA: Diagnosis not present

## 2023-06-09 DIAGNOSIS — Z170 Estrogen receptor positive status [ER+]: Secondary | ICD-10-CM | POA: Diagnosis not present

## 2023-06-09 DIAGNOSIS — Z1339 Encounter for screening examination for other mental health and behavioral disorders: Secondary | ICD-10-CM | POA: Diagnosis not present

## 2023-06-09 DIAGNOSIS — C50412 Malignant neoplasm of upper-outer quadrant of left female breast: Secondary | ICD-10-CM | POA: Diagnosis not present

## 2023-06-09 DIAGNOSIS — I10 Essential (primary) hypertension: Secondary | ICD-10-CM | POA: Diagnosis not present

## 2023-06-09 DIAGNOSIS — Z1331 Encounter for screening for depression: Secondary | ICD-10-CM | POA: Diagnosis not present

## 2023-06-09 DIAGNOSIS — Z79890 Hormone replacement therapy (postmenopausal): Secondary | ICD-10-CM | POA: Diagnosis not present

## 2023-06-09 DIAGNOSIS — F321 Major depressive disorder, single episode, moderate: Secondary | ICD-10-CM | POA: Diagnosis not present

## 2023-06-09 DIAGNOSIS — I447 Left bundle-branch block, unspecified: Secondary | ICD-10-CM | POA: Diagnosis not present

## 2023-06-09 DIAGNOSIS — R82998 Other abnormal findings in urine: Secondary | ICD-10-CM | POA: Diagnosis not present

## 2023-06-24 ENCOUNTER — Ambulatory Visit (INDEPENDENT_AMBULATORY_CARE_PROVIDER_SITE_OTHER): Payer: Medicare Other | Admitting: Otolaryngology

## 2023-06-24 ENCOUNTER — Encounter (INDEPENDENT_AMBULATORY_CARE_PROVIDER_SITE_OTHER): Payer: Self-pay

## 2023-06-24 VITALS — BP 131/77 | HR 65 | Ht 68.0 in | Wt 140.0 lb

## 2023-06-24 DIAGNOSIS — H6123 Impacted cerumen, bilateral: Secondary | ICD-10-CM | POA: Diagnosis not present

## 2023-06-24 NOTE — Progress Notes (Signed)
 Patient ID: Hailey Daniels, female   DOB: 12/29/1945, 78 y.o.   MRN: [id number]  Follow-up: Recurrent cerumen impaction  Procedure: Bilateral cerumen disimpaction.   Indication: Cerumen impaction, resulting in ear discomfort and conductive hearing loss.   Description: The patient is placed supine on the operating table. Under the operating microscope, the right ear canal is examined and is noted to be impacted with cerumen. The cerumen is carefully removed with a combination of suction catheters, cerumen curette, and alligator forceps. After the cerumen removal, the ear canal and tympanic membrane are noted to be normal. No middle ear effusion is noted. The same procedure is then repeated on the left side without exception. The patient tolerated the procedure well.  Follow-up care:  The patient is instructed not to use Q-tips to clean the ear canals. The patient will follow up as needed.

## 2023-07-12 DIAGNOSIS — Z85828 Personal history of other malignant neoplasm of skin: Secondary | ICD-10-CM | POA: Diagnosis not present

## 2023-07-12 DIAGNOSIS — L821 Other seborrheic keratosis: Secondary | ICD-10-CM | POA: Diagnosis not present

## 2023-07-12 DIAGNOSIS — L570 Actinic keratosis: Secondary | ICD-10-CM | POA: Diagnosis not present

## 2023-07-12 DIAGNOSIS — D1801 Hemangioma of skin and subcutaneous tissue: Secondary | ICD-10-CM | POA: Diagnosis not present

## 2023-07-12 DIAGNOSIS — L820 Inflamed seborrheic keratosis: Secondary | ICD-10-CM | POA: Diagnosis not present

## 2023-07-12 DIAGNOSIS — L300 Nummular dermatitis: Secondary | ICD-10-CM | POA: Diagnosis not present

## 2023-08-28 DIAGNOSIS — H109 Unspecified conjunctivitis: Secondary | ICD-10-CM | POA: Diagnosis not present

## 2023-09-07 DIAGNOSIS — H401131 Primary open-angle glaucoma, bilateral, mild stage: Secondary | ICD-10-CM | POA: Diagnosis not present

## 2023-09-07 DIAGNOSIS — H52203 Unspecified astigmatism, bilateral: Secondary | ICD-10-CM | POA: Diagnosis not present

## 2023-09-07 DIAGNOSIS — H26493 Other secondary cataract, bilateral: Secondary | ICD-10-CM | POA: Diagnosis not present

## 2023-09-07 DIAGNOSIS — Z961 Presence of intraocular lens: Secondary | ICD-10-CM | POA: Diagnosis not present

## 2023-10-11 DIAGNOSIS — I8392 Asymptomatic varicose veins of left lower extremity: Secondary | ICD-10-CM | POA: Diagnosis not present

## 2023-10-11 DIAGNOSIS — L570 Actinic keratosis: Secondary | ICD-10-CM | POA: Diagnosis not present

## 2023-10-11 DIAGNOSIS — Z85828 Personal history of other malignant neoplasm of skin: Secondary | ICD-10-CM | POA: Diagnosis not present

## 2023-10-11 DIAGNOSIS — D1801 Hemangioma of skin and subcutaneous tissue: Secondary | ICD-10-CM | POA: Diagnosis not present

## 2023-10-11 DIAGNOSIS — L821 Other seborrheic keratosis: Secondary | ICD-10-CM | POA: Diagnosis not present

## 2023-12-08 DIAGNOSIS — Z1231 Encounter for screening mammogram for malignant neoplasm of breast: Secondary | ICD-10-CM | POA: Diagnosis not present

## 2023-12-13 ENCOUNTER — Encounter: Payer: Self-pay | Admitting: Hematology and Oncology

## 2023-12-16 ENCOUNTER — Encounter: Payer: Self-pay | Admitting: Hematology and Oncology

## 2024-01-07 DIAGNOSIS — Z23 Encounter for immunization: Secondary | ICD-10-CM | POA: Diagnosis not present

## 2024-01-13 ENCOUNTER — Encounter (INDEPENDENT_AMBULATORY_CARE_PROVIDER_SITE_OTHER): Payer: Self-pay | Admitting: Otolaryngology

## 2024-01-13 ENCOUNTER — Ambulatory Visit (INDEPENDENT_AMBULATORY_CARE_PROVIDER_SITE_OTHER): Admitting: Otolaryngology

## 2024-01-13 VITALS — BP 138/85 | HR 70 | Temp 97.8°F | Ht 68.0 in | Wt 140.0 lb

## 2024-01-13 DIAGNOSIS — H6123 Impacted cerumen, bilateral: Secondary | ICD-10-CM

## 2024-01-13 NOTE — Progress Notes (Signed)
 Patient ID: Hailey Daniels, female   DOB: 02-23-1946, 77 y.o.   MRN: [id number]  Follow-up: Recurrent cerumen impaction  Procedure: Bilateral cerumen disimpaction.   Indication: Cerumen impaction, resulting in ear discomfort and conductive hearing loss.   Description: The patient is placed supine on the operating table. Under the operating microscope, the right ear canal is examined and is noted to be impacted with cerumen. The cerumen is carefully removed with a combination of suction catheters, cerumen curette, and alligator forceps. After the cerumen removal, the ear canal and tympanic membrane are noted to be normal. No middle ear effusion is noted. The same procedure is then repeated on the left side without exception. The patient tolerated the procedure well.  Follow-up care:  The patient is instructed not to use Q-tips to clean the ear canals. The patient will follow up as needed.

## 2024-02-02 DIAGNOSIS — D1801 Hemangioma of skin and subcutaneous tissue: Secondary | ICD-10-CM | POA: Diagnosis not present

## 2024-02-02 DIAGNOSIS — L570 Actinic keratosis: Secondary | ICD-10-CM | POA: Diagnosis not present

## 2024-02-02 DIAGNOSIS — Z85828 Personal history of other malignant neoplasm of skin: Secondary | ICD-10-CM | POA: Diagnosis not present

## 2024-02-02 DIAGNOSIS — L821 Other seborrheic keratosis: Secondary | ICD-10-CM | POA: Diagnosis not present

## 2024-03-14 DIAGNOSIS — H401131 Primary open-angle glaucoma, bilateral, mild stage: Secondary | ICD-10-CM | POA: Diagnosis not present

## 2024-04-27 ENCOUNTER — Ambulatory Visit: Payer: Medicare Other | Admitting: Hematology and Oncology

## 2024-05-02 ENCOUNTER — Inpatient Hospital Stay: Payer: Medicare Other | Admitting: Hematology and Oncology

## 2024-05-02 VITALS — BP 120/84 | HR 62 | Temp 97.8°F | Resp 18 | Ht 68.0 in | Wt 147.4 lb

## 2024-05-02 DIAGNOSIS — C50412 Malignant neoplasm of upper-outer quadrant of left female breast: Secondary | ICD-10-CM

## 2024-05-02 DIAGNOSIS — Z170 Estrogen receptor positive status [ER+]: Secondary | ICD-10-CM

## 2024-05-02 NOTE — Assessment & Plan Note (Signed)
 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  Adjuvant radiation therapy 04/18/20- 05/14/20 2. followed by adjuvant antiestrogen therapy started February 2022-09/16/2020 discontinued because of hot flashes Bone density ordered at [facility]   Breast Cancer Surveillance:  1. Mammograms 12/08/2023 at [place].  Benign, breast density category C 2. breast exam 05/02/2024: Benign   Hot flashes: Mild to moderate continuing in spite of not being on hormone therapy.   RTC in 1 year for follow-ups.

## 2025-05-02 ENCOUNTER — Inpatient Hospital Stay: Admitting: Hematology and Oncology
